# Patient Record
Sex: Male | Born: 1955
Health system: Southern US, Community
[De-identification: ages and names within clinical notes are randomized; demographics above are authoritative.]

## PROBLEM LIST (undated history)

## (undated) DIAGNOSIS — K5792 Diverticulitis of intestine, part unspecified, without perforation or abscess without bleeding: Secondary | ICD-10-CM

## (undated) HISTORY — PX: COLONOSCOPY: SHX174

---

## 2015-07-22 ENCOUNTER — Ambulatory Visit (INDEPENDENT_AMBULATORY_CARE_PROVIDER_SITE_OTHER): Payer: BLUE CROSS/BLUE SHIELD | Admitting: Physician Assistant

## 2015-07-22 ENCOUNTER — Inpatient Hospital Stay (HOSPITAL_COMMUNITY)
Admission: EM | Admit: 2015-07-22 | Discharge: 2015-07-28 | DRG: 392 | Disposition: A | Discharge status: Home / Self Care | Payer: BLUE CROSS/BLUE SHIELD | Attending: Internal Medicine | Admitting: Internal Medicine

## 2015-07-22 ENCOUNTER — Encounter (HOSPITAL_COMMUNITY): Payer: Self-pay

## 2015-07-22 ENCOUNTER — Ambulatory Visit (HOSPITAL_BASED_OUTPATIENT_CLINIC_OR_DEPARTMENT_OTHER)
Admission: RE | Admit: 2015-07-22 | Discharge: 2015-07-22 | Disposition: A | Discharge status: Home / Self Care | Payer: BLUE CROSS/BLUE SHIELD | Source: Ambulatory Visit | Attending: Physician Assistant | Admitting: Physician Assistant

## 2015-07-22 ENCOUNTER — Ambulatory Visit (INDEPENDENT_AMBULATORY_CARE_PROVIDER_SITE_OTHER): Payer: BLUE CROSS/BLUE SHIELD

## 2015-07-22 VITALS — BP 122/72 | HR 87 | Temp 99.6°F | Resp 17 | Ht 68.0 in | Wt 169.0 lb

## 2015-07-22 DIAGNOSIS — D72828 Other elevated white blood cell count: Secondary | ICD-10-CM

## 2015-07-22 DIAGNOSIS — R1030 Lower abdominal pain, unspecified: Secondary | ICD-10-CM

## 2015-07-22 DIAGNOSIS — R319 Hematuria, unspecified: Secondary | ICD-10-CM | POA: Diagnosis not present

## 2015-07-22 DIAGNOSIS — K869 Disease of pancreas, unspecified: Secondary | ICD-10-CM | POA: Diagnosis present

## 2015-07-22 DIAGNOSIS — K5792 Diverticulitis of intestine, part unspecified, without perforation or abscess without bleeding: Secondary | ICD-10-CM | POA: Diagnosis present

## 2015-07-22 DIAGNOSIS — D72829 Elevated white blood cell count, unspecified: Secondary | ICD-10-CM | POA: Diagnosis present

## 2015-07-22 DIAGNOSIS — K5730 Diverticulosis of large intestine without perforation or abscess without bleeding: Secondary | ICD-10-CM | POA: Diagnosis present

## 2015-07-22 DIAGNOSIS — K5720 Diverticulitis of large intestine with perforation and abscess without bleeding: Principal | ICD-10-CM | POA: Diagnosis present

## 2015-07-22 DIAGNOSIS — Z8249 Family history of ischemic heart disease and other diseases of the circulatory system: Secondary | ICD-10-CM

## 2015-07-22 DIAGNOSIS — K8689 Other specified diseases of pancreas: Secondary | ICD-10-CM | POA: Diagnosis present

## 2015-07-22 DIAGNOSIS — R1032 Left lower quadrant pain: Secondary | ICD-10-CM

## 2015-07-22 DIAGNOSIS — K631 Perforation of intestine (nontraumatic): Secondary | ICD-10-CM | POA: Diagnosis present

## 2015-07-22 DIAGNOSIS — K651 Peritoneal abscess: Secondary | ICD-10-CM

## 2015-07-22 HISTORY — DX: Diverticulitis of intestine, part unspecified, without perforation or abscess without bleeding: K57.92

## 2015-07-22 LAB — POCT URINALYSIS DIP (MANUAL ENTRY)
BILIRUBIN UA: NEGATIVE
GLUCOSE UA: NEGATIVE
Ketones, POC UA: NEGATIVE
Leukocytes, UA: NEGATIVE
Nitrite, UA: NEGATIVE
Protein Ur, POC: NEGATIVE
SPEC GRAV UA: 1.015
Urobilinogen, UA: 0.2
pH, UA: 6

## 2015-07-22 LAB — BASIC METABOLIC PANEL
ANION GAP: 11 (ref 5–15)
BUN: 10 mg/dL (ref 6–20)
CHLORIDE: 101 mmol/L (ref 101–111)
CO2: 23 mmol/L (ref 22–32)
Calcium: 9.2 mg/dL (ref 8.9–10.3)
Creatinine, Ser: 0.95 mg/dL (ref 0.61–1.24)
GFR calc non Af Amer: >60 mL/min (ref >60)
Glucose, Bld: 98 mg/dL (ref 65–99)
Potassium: 3.8 mmol/L (ref 3.5–5.1)
Sodium: 135 mmol/L (ref 135–145)

## 2015-07-22 LAB — POCT CBC
GRANULOCYTE PERCENT: 82.9 % — AB (ref 37–80)
HEMATOCRIT: 46.4 % (ref 43.5–53.7)
Hemoglobin: 16.4 g/dL (ref 14.1–18.1)
Lymph, poc: 1.4 (ref 0.6–3.4)
MCH: 31.4 pg — AB (ref 27–31.2)
MCHC: 35.3 g/dL (ref 31.8–35.4)
MCV: 88.8 fL (ref 80–97)
MID (CBC): 1.2 — AB (ref 0–0.9)
MPV: 7.2 fL (ref 0–99.8)
POC Granulocyte: 12.3 — AB (ref 2–6.9)
POC LYMPH PERCENT: 9.2 %L — AB (ref 10–50)
POC MID %: 7.9 % (ref 0–12)
Platelet Count, POC: 247 10*3/uL (ref 142–424)
RBC: 5.22 M/uL (ref 4.69–6.13)
RDW, POC: 13.4 %
WBC: 14.8 10*3/uL — AB (ref 4.6–10.2)

## 2015-07-22 LAB — LACTIC ACID, PLASMA: Lactic Acid, Venous: 0.9 mmol/L (ref 0.5–1.9)

## 2015-07-22 LAB — POC MICROSCOPIC URINALYSIS (UMFC)

## 2015-07-22 LAB — TYPE AND SCREEN
ABO/RH(D): B POS
ANTIBODY SCREEN: NEGATIVE

## 2015-07-22 LAB — PROTIME-INR
INR: 1.26 (ref 0.00–1.49)
Prothrombin Time: 15.9 seconds — ABNORMAL HIGH (ref 11.6–15.2)

## 2015-07-22 LAB — PROCALCITONIN: Procalcitonin: 0.12 ng/mL

## 2015-07-22 LAB — APTT: APTT: 29 s (ref 24–37)

## 2015-07-22 LAB — ABO/RH: ABO/RH(D): B POS

## 2015-07-22 MED ORDER — SODIUM CHLORIDE 0.9 % IV SOLN
INTRAVENOUS | Status: DC
  Administered 2015-07-22 – 2015-07-25 (×6): via INTRAVENOUS

## 2015-07-22 MED ORDER — SODIUM CHLORIDE 0.9 % IV SOLN: INTRAVENOUS | Status: DC

## 2015-07-22 MED ORDER — IOPAMIDOL (ISOVUE-300) INJECTION 61%
100.0000 mL | Freq: Once | INTRAVENOUS | Status: AC | PRN
  Administered 2015-07-22: 100 mL via INTRAVENOUS

## 2015-07-22 MED ORDER — SODIUM CHLORIDE 0.9 % IV BOLUS (SEPSIS)
1000.0000 mL | Freq: Once | INTRAVENOUS | Status: AC
  Administered 2015-07-22: 1000 mL via INTRAVENOUS

## 2015-07-22 MED ORDER — ONDANSETRON HCL 4 MG/2ML IJ SOLN
4.0000 mg | Freq: Once | INTRAMUSCULAR | Status: AC
  Administered 2015-07-22: 4 mg via INTRAVENOUS
  Filled 2015-07-22: qty 2

## 2015-07-22 MED ORDER — SODIUM CHLORIDE 0.9 % IV BOLUS (SEPSIS): 1500.0000 mL | Freq: Once | INTRAVENOUS | Status: DC

## 2015-07-22 MED ORDER — OXYCODONE-ACETAMINOPHEN 5-325 MG PO TABS
2.0000 | ORAL_TABLET | Freq: Four times a day (QID) | ORAL | Status: DC | PRN
  Administered 2015-07-22 – 2015-07-25 (×9): 2 via ORAL
  Administered 2015-07-26 (×2): 1 via ORAL
  Administered 2015-07-27: 2 via ORAL
  Administered 2015-07-27 – 2015-07-28 (×2): 1 via ORAL
  Filled 2015-07-22 (×14): qty 2

## 2015-07-22 MED ORDER — MORPHINE SULFATE (PF) 2 MG/ML IV SOLN
2.0000 mg | INTRAVENOUS | Status: DC | PRN
  Filled 2015-07-22: qty 1

## 2015-07-22 MED ORDER — ACETAMINOPHEN 650 MG RE SUPP: 650.0000 mg | Freq: Four times a day (QID) | RECTAL | Status: DC | PRN

## 2015-07-22 MED ORDER — CIPROFLOXACIN IN D5W 400 MG/200ML IV SOLN
400.0000 mg | Freq: Once | INTRAVENOUS | Status: AC
  Administered 2015-07-22: 400 mg via INTRAVENOUS
  Filled 2015-07-22: qty 200

## 2015-07-22 MED ORDER — ACETAMINOPHEN 325 MG PO TABS
650.0000 mg | ORAL_TABLET | Freq: Four times a day (QID) | ORAL | Status: DC | PRN
  Administered 2015-07-27: 650 mg via ORAL
  Filled 2015-07-22: qty 2

## 2015-07-22 MED ORDER — METRONIDAZOLE IN NACL 5-0.79 MG/ML-% IV SOLN
500.0000 mg | Freq: Once | INTRAVENOUS | Status: AC
  Administered 2015-07-22: 500 mg via INTRAVENOUS
  Filled 2015-07-22: qty 100

## 2015-07-22 MED ORDER — CIPROFLOXACIN IN D5W 400 MG/200ML IV SOLN: 400.0000 mg | Freq: Once | INTRAVENOUS | Status: DC

## 2015-07-22 MED ORDER — METRONIDAZOLE IN NACL 5-0.79 MG/ML-% IV SOLN
500.0000 mg | Freq: Three times a day (TID) | INTRAVENOUS | Status: AC
  Administered 2015-07-23 – 2015-07-27 (×13): 500 mg via INTRAVENOUS
  Filled 2015-07-22 (×14): qty 100

## 2015-07-22 MED ORDER — CIPROFLOXACIN IN D5W 400 MG/200ML IV SOLN: 400.0000 mg | Freq: Two times a day (BID) | INTRAVENOUS | Status: DC

## 2015-07-22 MED ORDER — ONDANSETRON HCL 4 MG/2ML IJ SOLN
4.0000 mg | Freq: Three times a day (TID) | INTRAMUSCULAR | Status: DC | PRN
  Administered 2015-07-24 – 2015-07-27 (×3): 4 mg via INTRAVENOUS
  Filled 2015-07-22 (×3): qty 2

## 2015-07-22 MED ORDER — HYDROMORPHONE HCL 1 MG/ML IJ SOLN
1.0000 mg | Freq: Once | INTRAMUSCULAR | Status: AC
  Administered 2015-07-22: 1 mg via INTRAVENOUS
  Filled 2015-07-22: qty 1

## 2015-07-22 MED ORDER — METRONIDAZOLE IN NACL 5-0.79 MG/ML-% IV SOLN: 500.0000 mg | Freq: Three times a day (TID) | INTRAVENOUS | Status: DC

## 2015-07-22 NOTE — Progress Notes (Signed)
07/22/2015 6:45 PM   DOB: 04-Mar-1955 / MRN: [id number]  SUBJECTIVE:  Marcus [name] is a 60 y.o. male presenting for left lower quadrant abdominal pain with radiation to the left groin. Associates dysuria.   Reports this episode started roughly 3 days ago, however he did have an episode of same about 4 months ago after an acute gastrointestinal illness. States the pain is sharp and comes in waves, and often times seems unbearable.  Took tylenol last night which helped him sleep.  He does not feel that he is getting worse or better.  Denies cough and SOB.   He has No Known Allergies.   He  has no past medical history on file.    He  reports that he has never smoked. He does not have any smokeless tobacco history on file. He  has no sexual activity history on file. The patient  has no past surgical history on file.  His family history is not on file.  Review of Systems  Constitutional: Negative for fever and chills.  Cardiovascular: Negative for chest pain.  Gastrointestinal: Positive for abdominal pain. Negative for heartburn, nausea, vomiting, diarrhea, constipation, blood in stool and melena.  Genitourinary: Positive for dysuria. Negative for urgency, frequency, hematuria and flank pain.  Skin: Negative for itching and rash.  Neurological: Negative for dizziness and headaches.    Problem list and medications reviewed and updated by myself where necessary, and exist elsewhere in the encounter.   OBJECTIVE:  BP 122/72 mmHg  Pulse 87  Temp(Src) 99.6 F (37.6 C) (Oral)  Resp 17  Ht 5\' 8"  (1.727 m)  Wt 169 lb (76.658 kg)  BMI 25.70 kg/m2  SpO2 98%  Physical Exam  Constitutional: He is oriented to person, place, and time. He appears well-developed. No distress.  Cardiovascular: Normal rate and regular rhythm.   Pulmonary/Chest: Effort normal and breath sounds normal. No respiratory distress. He has no wheezes. He has no rales. He exhibits no tenderness.  Abdominal: He  exhibits no distension and no mass. There is tenderness (llq). There is no rebound and no guarding. Hernia confirmed negative in the right inguinal area and confirmed negative in the left inguinal area.  Genitourinary: Penis normal. Right testis shows no tenderness. Left testis shows no tenderness.  Lymphadenopathy:       Right: No inguinal adenopathy present.       Left: No inguinal adenopathy present.  Neurological: He is alert and oriented to person, place, and time. He has normal reflexes.    Results for orders placed or performed in visit on 07/22/15 (from the past 72 hour(s))  POCT urinalysis dipstick     Status: Abnormal   Collection Time: 07/22/15  2:58 PM  Result Value Ref Range   Color, UA yellow yellow   Clarity, UA clear clear   Glucose, UA negative negative   Bilirubin, UA negative negative   Ketones, POC UA negative negative   Spec Grav, UA 1.015    Blood, UA trace-intact (A) negative   pH, UA 6.0    Protein Ur, POC negative negative   Urobilinogen, UA 0.2    Nitrite, UA Negative Negative   Leukocytes, UA Negative Negative  POCT CBC     Status: Abnormal   Collection Time: 07/22/15  3:14 PM  Result Value Ref Range   WBC 14.8 (A) 4.6 - 10.2 K/uL   Lymph, poc 1.4 0.6 - 3.4   POC LYMPH PERCENT 9.2 (A) 10 - 50 %L   MID (  cbc) 1.2 (A) 0 - 0.9   POC MID % 7.9 0 - 12 %M   POC Granulocyte 12.3 (A) 2 - 6.9   Granulocyte percent 82.9 (A) 37 - 80 %G   RBC 5.22 4.69 - 6.13 M/uL   Hemoglobin 16.4 14.1 - 18.1 g/dL   HCT, POC 69.646.4 29.543.5 - 53.7 %   MCV 88.8 80 - 97 fL   MCH, POC 31.4 (A) 27 - 31.2 pg   MCHC 35.3 31.8 - 35.4 g/dL   RDW, POC 28.413.4 %   Platelet Count, POC 247 142 - 424 K/uL   MPV 7.2 0 - 99.8 fL  POCT Microscopic Urinalysis ([facility])     Status: Abnormal   Collection Time: 07/22/15  3:27 PM  Result Value Ref Range   WBC,UR,HPF,POC None None WBC/hpf   RBC,UR,HPF,POC None None RBC/hpf   Bacteria Few (A) None, Too numerous to count   Mucus Present (A) Absent    Epithelial Cells, UR Per Microscopy Few (A) None, Too numerous to count cells/hpf    Ct Abdomen Pelvis W Contrast  07/22/2015  CLINICAL DATA:  Left lower quadrant pain. EXAM: CT ABDOMEN AND PELVIS WITH CONTRAST TECHNIQUE: Multidetector CT imaging of the abdomen and pelvis was performed using the standard protocol following bolus administration of intravenous contrast. CONTRAST:  100mL ISOVUE-300 IOPAMIDOL (ISOVUE-300) INJECTION 61% COMPARISON:  KUB from earlier today FINDINGS: Lower chest:  No acute findings. Hepatobiliary: There is 15 mm low-attenuation mass in the right hepatic lobe on series 2, image 22 which is too small to characterize but most likely either a cyst or hemangioma. Hepatic steatosis is identified. The liver, portal vein, and gallbladder are otherwise normal. Pancreas: There is a tiny low-attenuation lesion measuring 4 mm in the pancreatic head on series 2, image 25. The attenuation in this lesion is 3 Hounsfield units most consistent with a small cyst. The remainder of the pancreas is normal in appearance. Spleen: Within normal limits in size and appearance. Adrenals/Urinary Tract: No masses identified. No evidence of hydronephrosis. Stomach/Bowel: The stomach small bowel are normal. Diverticuli are seen throughout colon, particularly in the sigmoid colon. There is fat stranding adjacent to the sigmoid colon with extraluminal gas and fluid seen on series 2, image 64 measuring 4.6 x 2.0 cm, consistent with perforated diverticulitis. There is mild associated bowel wall thickening in this region. The remainder of the colon and appendix are normal in appearance. Vascular/Lymphatic: There is a fat containing umbilical hernia. The abdominal aorta is normal. No adenopathy. Reproductive: No mass or other significant abnormality. Other: None. Musculoskeletal: Degenerative changes are seen in the SI joints. No other bony abnormalities. IMPRESSION: 1. Perforated sigmoid diverticulitis with a 4.6 x 2.0  cm collection of extraluminal gas and air. No free air. 2. 4 mm low-attenuation mass in pancreatic head, likely a small cyst. Recommend an MRI in 1 year to ensure stability. 3. No other acute abnormalities. The findings were called to the patient's physician assistant, Deliah BostonMichael Rosalie Buenaventura. Electronically Signed   By: Gerome Samavid  Williams III M.D   On: 07/22/2015 18:26   Dg Abd 2 Views  07/22/2015  CLINICAL DATA:  Left lower abdomen pain radiating to the groin EXAM: ABDOMEN - 2 VIEW COMPARISON:  None. FINDINGS: No opaque renal or ureteral calculi are noted by plain film. The bowel gas pattern is nonspecific. No bony abnormality is seen. IMPRESSION: No opaque calculi are noted.  The bowel gas pattern is nonspecific. Electronically Signed   By: Lucienne MinksPaul  Barry M.D.  On: 07/22/2015 15:11    ASSESSMENT AND PLAN  Darly was seen today for abdominal pain.  Diagnoses and all orders for this visit:  Nethaniel was seen today for abdominal pain.  Diagnoses and all orders for this visit:  Lower abdominal pain: Rads negative.  CBC showing concern for an acute intraabdominal process. Will CT stat. Update @ 6:45 PM Radiology called and advised of perforated diverticulum.  We have called the patient and advised that he go directly to [facility] ED for further eval and management.  We have called [facility] charge and advised that he will be coming in.   -     POCT CBC -     POCT urinalysis dipstick -     COMPLETE METABOLIC PANEL WITH GFR -     Lipase -     CT ABDOMEN PELVIS W CONTRAST; Future  Left groin pain -     DG Abd 2 Views; Future  Hematuria -     POCT Microscopic Urinalysis ([facility])  Granulocytosis -     CT ABDOMEN PELVIS W CONTRAST; Future     The patient was advised to call or return to clinic if he does not see an improvement in symptoms, or to seek the care of the closest emergency department if he worsens with the above plan.   Deliah Boston, MHS, PA-C [facility] 07/22/2015 6:45 PM

## 2015-07-22 NOTE — H&P (Signed)
History and Physical    Marcus [name] ZOX:[id number] DOB: 1955-07-17 DOA: 07/22/2015  Referring MD/NP/PA:   PCP: Dolores Lory, PA-C   Patient coming from:  The patient is coming from home.  At baseline, pt is independent for most of ADL.     Chief Complaint: Abdominal pain  HPI: Marcus [name] is a 60 y.o. male with medical history significant of diverticulitis, who presents with abdominal pain.  Patient reports that his abdominal pain started yesterday. It is located lower abdomen, mainly in the left side, intermittent, sharp, radiating to the groin areas. It happens every 2 or 3 hours, each time it lasts for about 1 to 1.5 hours. He does not have nausea, vomiting, diarrhea. No fever or chills. He tool ibuprofen and Tylenol with some help at home. Patient was seen in urgent care today, and had CT abdomen/pelvis, showing perforated sigmoid colon and diverticulitis. Patient reports mild dry cough, but no chest pain or shortness of breath. No symptoms of UTI. No unilateral weakness or rashes.  ED Course: pt was found to have WBC 14.8, temperature 99.9, heart rate at 90s, electrolytes and renal function okay. Patient is admitted to MedSurg bed for further direction treatment with general surgeon will be consulted by EDP.  Review of Systems:   General: no fevers, chills, no changes in body weight, has poor appetite, has fatigue HEENT: no blurry vision, hearing changes or sore throat Pulm: no dyspnea, has coughing, no wheezing CV: no chest pain, no palpitations Abd: no nausea, vomiting, has abdominal pain, no diarrhea, constipation GU: no dysuria, burning on urination, increased urinary frequency, hematuria  Ext: no leg edema Neuro: no unilateral weakness, numbness, or tingling, no vision change or hearing loss Skin: no rash MSK: No muscle spasm, no deformity, no limitation of range of movement in spin Heme: No easy bruising.  Travel history: No recent long distant  travel.  Allergy: No Known Allergies  Past Medical History  Diagnosis Date  . Diverticulitis     Past Surgical History  Procedure Laterality Date  . Colonoscopy      Social History:  reports that he has never smoked. He does not have any smokeless tobacco history on file. He reports that he does not drink alcohol or use illicit drugs.  Family History:  Family History  Problem Relation Age of Onset  . Diverticulitis Mother   . Urolithiasis Father   . Hypertension Father   . Hypertension Brother   . Depression Brother      Prior to Admission medications   Medication Sig Start Date End Date Taking? Authorizing Provider  acetaminophen (TYLENOL) 500 MG tablet Take 500 mg by mouth every 6 (six) hours as needed for moderate pain.   Yes Historical Provider, MD  ibuprofen (ADVIL,MOTRIN) 200 MG tablet Take 400 mg by mouth every 6 (six) hours as needed for moderate pain.   Yes Historical Provider, MD    Physical Exam: Filed Vitals:   07/22/15 1934  BP: 130/79  Pulse: 96  Temp: 99.9 F (37.7 C)  TempSrc: Oral  Resp: 16  Height: 5\' 8"  (1.727 m)  Weight: 76.658 kg (169 lb)  SpO2: 96%   General: Not in acute distress HEENT:       Eyes: PERRL, EOMI, no scleral icterus.       ENT: No discharge from the ears and nose, no pharynx injection, no tonsillar enlargement.        Neck: No JVD, no bruit, no mass felt. Heme: No neck lymph  node enlargement. Cardiac: S1/S2, RRR, No murmurs, No gallops or rubs. Pulm:  No rales, wheezing, rhonchi or rubs. Abd: Soft, nondistended, tender over lower abdomen, no rebound pain, no organomegaly, BS present. GU: No hematuria Ext: No pitting leg edema bilaterally. 2+DP/PT pulse bilaterally. Musculoskeletal: No joint deformities, No joint redness or warmth, no limitation of ROM in spin. Skin: No rashes.  Neuro: Alert, oriented X3, cranial nerves II-XII grossly intact, moves all extremities normally. Psych: Patient is not psychotic, no suicidal or  hemocidal ideation.  Labs on Admission: I have personally reviewed following labs and imaging studies  CBC:  Recent Labs Lab 07/22/15 1514  WBC 14.8*  HGB 16.4  HCT 46.4  MCV 88.8   Basic Metabolic Panel:  Recent Labs Lab 07/22/15 1936  NA 135  K 3.8  CL 101  CO2 23  GLUCOSE 98  BUN 10  CREATININE 0.95  CALCIUM 9.2   GFR: Estimated Creatinine Clearance: 81 mL/min (by C-G formula based on Cr of 0.95). Liver Function Tests: No results for input(s): AST, ALT, ALKPHOS, BILITOT, PROT, ALBUMIN in the last 168 hours. No results for input(s): LIPASE, AMYLASE in the last 168 hours. No results for input(s): AMMONIA in the last 168 hours. Coagulation Profile: No results for input(s): INR, PROTIME in the last 168 hours. Cardiac Enzymes: No results for input(s): CKTOTAL, CKMB, CKMBINDEX, TROPONINI in the last 168 hours. BNP (last 3 results) No results for input(s): PROBNP in the last 8760 hours. HbA1C: No results for input(s): HGBA1C in the last 72 hours. CBG: No results for input(s): GLUCAP in the last 168 hours. Lipid Profile: No results for input(s): CHOL, HDL, LDLCALC, TRIG, CHOLHDL, LDLDIRECT in the last 72 hours. Thyroid Function Tests: No results for input(s): TSH, T4TOTAL, FREET4, T3FREE, THYROIDAB in the last 72 hours. Anemia Panel: No results for input(s): VITAMINB12, FOLATE, FERRITIN, TIBC, IRON, RETICCTPCT in the last 72 hours. Urine analysis:    Component Value Date/Time   BILIRUBINUR negative 07/22/2015 1458   KETONESUR negative 07/22/2015 1458   PROTEINUR negative 07/22/2015 1458   UROBILINOGEN 0.2 07/22/2015 1458   NITRITE Negative 07/22/2015 1458   LEUKOCYTESUR Negative 07/22/2015 1458   Sepsis Labs: @LABRCNTIP (procalcitonin:4,lacticidven:4) )No results found for this or any previous visit (from the past 240 hour(s)).   Radiological Exams on Admission: Ct Abdomen Pelvis W Contrast  07/22/2015  CLINICAL DATA:  Left lower quadrant pain. EXAM: CT  ABDOMEN AND PELVIS WITH CONTRAST TECHNIQUE: Multidetector CT imaging of the abdomen and pelvis was performed using the standard protocol following bolus administration of intravenous contrast. CONTRAST:  100mL ISOVUE-300 IOPAMIDOL (ISOVUE-300) INJECTION 61% COMPARISON:  KUB from earlier today FINDINGS: Lower chest:  No acute findings. Hepatobiliary: There is 15 mm low-attenuation mass in the right hepatic lobe on series 2, image 22 which is too small to characterize but most likely either a cyst or hemangioma. Hepatic steatosis is identified. The liver, portal vein, and gallbladder are otherwise normal. Pancreas: There is a tiny low-attenuation lesion measuring 4 mm in the pancreatic head on series 2, image 25. The attenuation in this lesion is 3 Hounsfield units most consistent with a small cyst. The remainder of the pancreas is normal in appearance. Spleen: Within normal limits in size and appearance. Adrenals/Urinary Tract: No masses identified. No evidence of hydronephrosis. Stomach/Bowel: The stomach small bowel are normal. Diverticuli are seen throughout colon, particularly in the sigmoid colon. There is fat stranding adjacent to the sigmoid colon with extraluminal gas and fluid seen on series 2, image  64 measuring 4.6 x 2.0 cm, consistent with perforated diverticulitis. There is mild associated bowel wall thickening in this region. The remainder of the colon and appendix are normal in appearance. Vascular/Lymphatic: There is a fat containing umbilical hernia. The abdominal aorta is normal. No adenopathy. Reproductive: No mass or other significant abnormality. Other: None. Musculoskeletal: Degenerative changes are seen in the SI joints. No other bony abnormalities. IMPRESSION: 1. Perforated sigmoid diverticulitis with a 4.6 x 2.0 cm collection of extraluminal gas and air. No free air. 2. 4 mm low-attenuation mass in pancreatic head, likely a small cyst. Recommend an MRI in 1 year to ensure stability. 3. No  other acute abnormalities. The findings were called to the patient's physician assistant, Deliah Boston. Electronically Signed   By: Gerome Sam III M.D   On: 07/22/2015 18:26   Dg Abd 2 Views  07/22/2015  CLINICAL DATA:  Left lower abdomen pain radiating to the groin EXAM: ABDOMEN - 2 VIEW COMPARISON:  None. FINDINGS: No opaque renal or ureteral calculi are noted by plain film. The bowel gas pattern is nonspecific. No bony abnormality is seen. IMPRESSION: No opaque calculi are noted.  The bowel gas pattern is nonspecific. Electronically Signed   By: Dwyane Dee M.D.   On: 07/22/2015 15:11     EKG: Not done in ED, will get one.   Assessment/Plan Principal Problem:   Perforated sigmoid colon (HCC) Active Problems:   Mass of pancreas   Diverticulitis   Diverticulitis in the perforated sigmoid colon: Patient's abdominal pain is caused by perforated sigmoid colon and diverticulitis as evidenced by CT abdomen/pelvis. Patient has leukocytosis with WBC 14.8, but not toxic. Currently patient does not meet criteria for sepsis, but lactate level is pending. If lactate level is elevated, patient will then meet criteria for sepsis. Currently hemodynamically stable.  -will admit to med-surg bed -keep NPO -Gen. surgery will be consulted by EDP, with follow-up recommendations -When necessary Zofran for nausea, morphine and Percocet for pain. -will get Procalcitonin and trend lactic acid levels -IVF: 2L of NS bolus in ED, followed by 125 cc/h  -INR/PTT/type & screen -IV Flagyl and Cipro  Mass of pancreas: pt had an incidental findings by CT abdomen/pelvis, showing 4 mm low-attenuation mass in pancreatic head, likely a small cyst per radiologist. -Recommend an MRI in 1 year to ensure stability per radiologist.   DVT ppx: SCD Code Status: Full code Family Communication: None at bed side.  Disposition Plan:  Anticipate discharge back to previous home environment Consults called:  General surgeon  will be consulted by EDP. Admission status:  medical floor/obs    Date of Service 07/22/2015    Lorretta Harp [facility] Pager [phone]  If 7PM-7AM, please contact night-coverage [email or web address] Password [facility] 07/22/2015, 9:18 PM

## 2015-07-22 NOTE — ED Notes (Signed)
Pt sent by Dr after CT scan. CT scan showed perforated diverticulum. Pt A&Ox4. Endorses severe abdominal pain. Ambulatory. Denies history of diverticulitis.

## 2015-07-22 NOTE — Patient Instructions (Signed)
Please report now to [facility] at [address].  Phone number is [phone].    IF you received an x-ray today, you will receive an invoice from [facility]. Please contact [facility] at [phone] with questions or concerns regarding your invoice.   IF you received labwork today, you will receive an invoice from [facility]. Please contact [facility] at [phone] with questions or concerns regarding your invoice.   Our billing staff will not be able to assist you with questions regarding bills from these companies.  You will be contacted with the lab results as soon as they are available. The fastest way to get your results is to activate your My Chart account. Instructions are located on the last page of this paperwork. If you have not heard from us regarding the results in 2 weeks, please contact this office.

## 2015-07-22 NOTE — Consult Note (Signed)
Discussed case with Dr. Tona SensingSteinl--CT scan reviewed.  CCS will follow up with this patient who has diverticulitis with an abscess around his sigmoid.  IV antibiotics and IR consult regarding drainage indicated.  Patient otherwise is very stable.  Wenda LowMatt Mirra Basilio, MD, FACS

## 2015-07-22 NOTE — Progress Notes (Signed)
Pharmacy Antibiotic Note  Marcus [name] is a 60 y.o. male admitted on 07/22/2015 with perforated diverticulitis.  Pharmacy has been consulted for ciprofloxacin dosing.  Plan:  Ciprofloxacin 400 mg IV q12 hr  Flagyl per MD  Please note that due to local E. coli resistance rates to Ciprofloxacin > 10%, Rocephin (in conjunction with Flagyl) is preferred over Cipro for Tx of intra-abdominal infections  Height: 5\' 8"  (172.7 cm) Weight: 169 lb (76.658 kg) IBW/kg (Calculated) : 68.4  Temp (24hrs), Avg:99.8 F (37.7 C), Min:99.6 F (37.6 C), Max:99.9 F (37.7 C)   Recent Labs Lab 07/22/15 1514 07/22/15 1936  WBC 14.8*  --   CREATININE  --  0.95    Estimated Creatinine Clearance: 81 mL/min (by C-G formula based on Cr of 0.95).    No Known Allergies  Antimicrobials this admission: Cipro 7/18 >>  Flagyl (MD) 7/18 >>   Dose adjustments this admission: ---  Microbiology results: 7/18 BCx: sent   Thank you for allowing pharmacy to be a part of this patient's care.  Bernadene Personrew Dorna Mallet, PharmD, BCPS Pager: [phone] 07/22/2015, 9:36 PM

## 2015-07-22 NOTE — ED Notes (Signed)
MD at bedside. 

## 2015-07-22 NOTE — ED Notes (Signed)
Clark, PA-C called. [facility] confirmed perforated divert[facility]. Sent to ED for eval.

## 2015-07-22 NOTE — ED Provider Notes (Addendum)
CSN: [id number]     Arrival date & time 07/22/15  1912 History   First MD Initiated Contact with Patient 07/22/15 1914     Chief Complaint  Patient presents with  . Abdominal Pain     (Consider location/radiation/quality/duration/timing/severity/associated sxs/prior Treatment) Patient is a 60 y.o. male presenting with abdominal pain. The history is provided by the patient.  Abdominal Pain Associated symptoms: nausea   Associated symptoms: no chest pain, no chills, no dysuria, no shortness of breath, no sore throat and no vomiting   Patient c/o lower abdominal pain, esp left, onset yesterday. Pain persistent since, mod-severe, waxes and wanes in intensity.  No hx same pain. No radiation. Nausea. No vomiting. Low grade fever in ED, no chills or sweats. No dysuria or gu c/o.   No prior hx diverticula/itis.  Patient had outpatient ct today with perforated diverticulitis.       History reviewed. No pertinent past medical history. History reviewed. No pertinent past surgical history. History reviewed. No pertinent family history. Social History  Substance Use Topics  . Smoking status: Never Smoker   . Smokeless tobacco: None  . Alcohol Use: None    Review of Systems  Constitutional: Negative for chills.  HENT: Negative for sore throat.   Eyes: Negative for redness.  Respiratory: Negative for shortness of breath.   Cardiovascular: Negative for chest pain.  Gastrointestinal: Positive for nausea and abdominal pain. Negative for vomiting.  Genitourinary: Negative for dysuria.  Musculoskeletal: Negative for back pain and neck pain.  Skin: Negative for rash.  Neurological: Negative for headaches.  Hematological: Does not bruise/bleed easily.  Psychiatric/Behavioral: Negative for confusion.      Allergies  Review of patient's allergies indicates no known allergies.  Home Medications   Prior to Admission medications   Medication Sig Start Date End Date Taking? Authorizing  Provider  acetaminophen (TYLENOL) 500 MG tablet Take 500 mg by mouth every 6 (six) hours as needed for moderate pain.   Yes Historical Provider, MD  ibuprofen (ADVIL,MOTRIN) 200 MG tablet Take 400 mg by mouth every 6 (six) hours as needed for moderate pain.   Yes Historical Provider, MD   BP 130/79 mmHg  Pulse 96  Temp(Src) 99.9 F (37.7 C) (Oral)  Resp 16  Ht 5\' 8"  (1.727 m)  Wt 76.658 kg  BMI 25.70 kg/m2  SpO2 96% Physical Exam  Constitutional: He appears well-developed and well-nourished. No distress.  HENT:  Mouth/Throat: Oropharynx is clear and moist.  Eyes: Conjunctivae are normal. No scleral icterus.  Neck: Neck supple. No tracheal deviation present.  Cardiovascular: Normal rate, regular rhythm, normal heart sounds and intact distal pulses.   No murmur heard. Pulmonary/Chest: Effort normal and breath sounds normal. No accessory muscle usage. No respiratory distress.  Abdominal: Soft. Bowel sounds are normal. He exhibits no distension and no mass. There is tenderness. There is no rebound and no guarding.  Moderate LLQ tenderness.   Genitourinary:  No cva tenderness  Musculoskeletal: He exhibits no edema.  Neurological: He is alert.  Skin: Skin is warm and dry. No rash noted. He is not diaphoretic.  Psychiatric: He has a normal mood and affect.  Nursing note and vitals reviewed.   ED Course  Procedures (including critical care time) Labs Review   Results for orders placed or performed during the hospital encounter of 07/22/15  Basic metabolic panel  Result Value Ref Range   Sodium 135 135 - 145 mmol/L   Potassium 3.8 3.5 - 5.1 mmol/L  Chloride 101 101 - 111 mmol/L   CO2 23 22 - 32 mmol/L   Glucose, Bld 98 65 - 99 mg/dL   BUN 10 6 - 20 mg/dL   Creatinine, Ser 1.47 0.61 - 1.24 mg/dL   Calcium 9.2 8.9 - 82.9 mg/dL   GFR calc non Af Amer >60 >60 mL/min   GFR calc Af Amer >60 >60 mL/min   Anion gap 11 5 - 15   Ct Abdomen Pelvis W Contrast  07/22/2015  CLINICAL  DATA:  Left lower quadrant pain. EXAM: CT ABDOMEN AND PELVIS WITH CONTRAST TECHNIQUE: Multidetector CT imaging of the abdomen and pelvis was performed using the standard protocol following bolus administration of intravenous contrast. CONTRAST:  ISOVUE-300 IOPAMIDOL (ISOVUE-300) INJECTION 61% COMPARISON:  KUB from earlier today FINDINGS: Lower chest:  No acute findings. Hepatobiliary: There is 15 mm low-attenuation mass in the right hepatic lobe on series 2, image 22 which is too small to characterize but most likely either a cyst or hemangioma. Hepatic steatosis is identified. The liver, portal vein, and gallbladder are otherwise normal. Pancreas: There is a tiny low-attenuation lesion measuring 4 mm in the pancreatic head on series 2, image 25. The attenuation in this lesion is 3 Hounsfield units most consistent with a small cyst. The remainder of the pancreas is normal in appearance. Spleen: Within normal limits in size and appearance. Adrenals/Urinary Tract: No masses identified. No evidence of hydronephrosis. Stomach/Bowel: The stomach small bowel are normal. Diverticuli are seen throughout colon, particularly in the sigmoid colon. There is fat stranding adjacent to the sigmoid colon with extraluminal gas and fluid seen on series 2, image 64 measuring 4.6 x 2.0 cm, consistent with perforated diverticulitis. There is mild associated bowel wall thickening in this region. The remainder of the colon and appendix are normal in appearance. Vascular/Lymphatic: There is a fat containing umbilical hernia. The abdominal aorta is normal. No adenopathy. Reproductive: No mass or other significant abnormality. Other: None. Musculoskeletal: Degenerative changes are seen in the SI joints. No other bony abnormalities. IMPRESSION: 1. Perforated sigmoid diverticulitis with a 4.6 x 2.0 cm collection of extraluminal gas and air. No free air. 2. 4 mm low-attenuation mass in pancreatic head, likely a small cyst. Recommend an  MRI in 1 year to ensure stability. 3. No other acute abnormalities. The findings were called to the patient's physician assistant, Deliah Boston. Electronically Signed   By: Gerome Sam III M.D   On: 07/22/2015 18:26   Dg Abd 2 Views  07/22/2015  CLINICAL DATA:  Left lower abdomen pain radiating to the groin EXAM: ABDOMEN - 2 VIEW COMPARISON:  None. FINDINGS: No opaque renal or ureteral calculi are noted by plain film. The bowel gas pattern is nonspecific. No bony abnormality is seen. IMPRESSION: No opaque calculi are noted.  The bowel gas pattern is nonspecific. Electronically Signed   By: Dwyane Dee M.D.   On: 07/22/2015 15:11       I have personally reviewed and evaluated these images and lab results as part of my medical decision-making.    MDM   Iv ns bolus.  Dilaudid 1 mg iv, zofran iv.  cipro and flagyl iv.  Reviewed nursing notes and prior charts for additional history.   Patient with cbc from office visit today, bmet added to labs.  Hospitalists consulted for admission.  Patient may need IR consult in AM for possible guided drain procedure.   Discussed with Dr Clyde Lundborg - he requests temp orders to med/surg bed,  and a gene surgery consult. General surgery consulted. Temp orders placed.   Discussed with Dr Wenda LowMatt Martin, he indicates they will follow/consult, and that they will plan to see in AM tomorrow (and that for now/tonight, ivf, iv abx, no additional overnight tx/eval needed).      Cathren LaineKevin Hazael Olveda, MD 07/22/15 2136

## 2015-07-23 DIAGNOSIS — K631 Perforation of intestine (nontraumatic): Secondary | ICD-10-CM

## 2015-07-23 LAB — COMPLETE METABOLIC PANEL WITH GFR
ALT: 23 U/L (ref 9–46)
AST: 17 U/L (ref 10–35)
Albumin: 4.4 g/dL (ref 3.6–5.1)
Alkaline Phosphatase: 46 U/L (ref 40–115)
BUN: 11 mg/dL (ref 7–25)
CALCIUM: 9.5 mg/dL (ref 8.6–10.3)
CHLORIDE: 101 mmol/L (ref 98–110)
CO2: 26 mmol/L (ref 20–31)
CREATININE: 1.07 mg/dL (ref 0.70–1.33)
GFR, Est African American: 87 mL/min (ref >=60)
GFR, Est Non African American: 76 mL/min (ref >=60)
Glucose, Bld: 89 mg/dL (ref 65–99)
Potassium: 5 mmol/L (ref 3.5–5.3)
Sodium: 140 mmol/L (ref 135–146)
Total Bilirubin: 1.3 mg/dL — ABNORMAL HIGH (ref 0.2–1.2)
Total Protein: 7 g/dL (ref 6.1–8.1)

## 2015-07-23 LAB — CBC
HEMATOCRIT: 41.9 % (ref 39.0–52.0)
HEMOGLOBIN: 14.4 g/dL (ref 13.0–17.0)
MCH: 30.3 pg (ref 26.0–34.0)
MCHC: 34.4 g/dL (ref 30.0–36.0)
MCV: 88 fL (ref 78.0–100.0)
Platelets: 213 10*3/uL (ref 150–400)
RBC: 4.76 MIL/uL (ref 4.22–5.81)
RDW: 13.1 % (ref 11.5–15.5)
WBC: 15 10*3/uL — ABNORMAL HIGH (ref 4.0–10.5)

## 2015-07-23 LAB — BASIC METABOLIC PANEL
ANION GAP: 5 (ref 5–15)
BUN: 9 mg/dL (ref 6–20)
CO2: 25 mmol/L (ref 22–32)
Calcium: 8.1 mg/dL — ABNORMAL LOW (ref 8.9–10.3)
Chloride: 106 mmol/L (ref 101–111)
Creatinine, Ser: 0.9 mg/dL (ref 0.61–1.24)
GLUCOSE: 96 mg/dL (ref 65–99)
POTASSIUM: 3.7 mmol/L (ref 3.5–5.1)
Sodium: 136 mmol/L (ref 135–145)

## 2015-07-23 LAB — LIPASE: LIPASE: 20 U/L (ref 7–60)

## 2015-07-23 LAB — LACTIC ACID, PLASMA: Lactic Acid, Venous: 0.8 mmol/L (ref 0.5–1.9)

## 2015-07-23 LAB — GLUCOSE, CAPILLARY: GLUCOSE-CAPILLARY: 85 mg/dL (ref 65–99)

## 2015-07-23 MED ORDER — CEFTRIAXONE SODIUM 2 G IJ SOLR
2.0000 g | Freq: Every day | INTRAMUSCULAR | Status: AC
  Administered 2015-07-23 – 2015-07-27 (×5): 2 g via INTRAVENOUS
  Filled 2015-07-23 (×5): qty 2

## 2015-07-23 MED ORDER — LIP MEDEX EX OINT
TOPICAL_OINTMENT | CUTANEOUS | Status: AC
  Administered 2015-07-23: 12:00:00
  Filled 2015-07-23: qty 7

## 2015-07-23 NOTE — Consult Note (Signed)
Reason for Consult:  Diverticulitis with perforation Referring Physician: Dr. Colin Broach PCP:  Carl Best  Marcus [name] is an 60 y.o. male.  HPI: Pt presented to the Urgent Care facility yesterday with abdominal pain LLQ going to the groin.  This started 3 days ago, but had a similar episode about 4 months ago. The first episode lasted about 2-3 days.  Started with similar pain, but not as severe.  He could not eat and stayed in bed the first day, lived on soup for 2-3 days and the pain passed.   The pain yesterday started about 3 PM, pain comes in waves and is sharpe, very severe, more on the left than the right and goes to his groin.  He took Tylenol and Ibuprofen for pain. Then called his PCP.  He was seen at the Urgent care and after CT admitted thru the ED.  Work up show he is afebrile, VSS, CMP is normal, WBC was 14.8, UA was normal.  CT scan shows: The stomach small bowel are normal. Diverticuli are seen throughout colon, particularly in the sigmoid colon. There is fat stranding adjacent to the sigmoid colon with extraluminal gas and fluid seen on series 2, image 64 measuring 4.6 x 2.0 cm, consistent with perforated diverticulitis. There is mild associated bowel wall thickening in this region. The remainder of the colon and appendix are normal in appearance.  4 mm low-attenuation mass in pancreatic head, likely a small cyst. Recommend an MRI in 1 year to ensure stability.   He was then sent to the ED and admitted to Medicine.  He was started on Cipro and Flagyl IV and we are ask to see.  WBC this AM is up to 15K.  Past Medical History  Diagnosis Date  . Diverticulitis     Past Surgical History  Procedure Laterality Date  . Colonoscopy      Family History  Problem Relation Age of Onset  . Diverticulitis Mother   . Urolithiasis Father   . Hypertension Father   . Hypertension Brother   . Depression Brother     Social History:  reports that he has never smoked. He does not have  any smokeless tobacco history on file. He reports that he does not drink alcohol or use illicit drugs. Married; works in [employer] 1 son 17 ETOH: none Tobacco:  None Drugs: none  Allergies: No Known Allergies  Medications:  Prior to Admission:  Prescriptions prior to admission  Medication Sig Dispense Refill Last Dose  . acetaminophen (TYLENOL) 500 MG tablet Take 500 mg by mouth every 6 (six) hours as needed for moderate pain.   07/22/2015 at Unknown time  . ibuprofen (ADVIL,MOTRIN) 200 MG tablet Take 400 mg by mouth every 6 (six) hours as needed for moderate pain.   07/22/2015 at Unknown time   Anti-infectives    Start     Dose/Rate Route Frequency Ordered Stop   07/23/15 1000  ciprofloxacin (CIPRO) IVPB 400 mg     400 mg 200 mL/hr over 60 Minutes Intravenous 2 times daily 07/22/15 2130     07/23/15 0400  metroNIDAZOLE (FLAGYL) IVPB 500 mg     500 mg 100 mL/hr over 60 Minutes Intravenous Every 8 hours 07/22/15 2130     07/22/15 2200  ciprofloxacin (CIPRO) IVPB 400 mg     400 mg 200 mL/hr over 60 Minutes Intravenous  Once 07/22/15 2100     07/22/15 2115  metroNIDAZOLE (FLAGYL) IVPB 500 mg  Status:  Discontinued  500 mg 100 mL/hr over 60 Minutes Intravenous Every 8 hours 07/22/15 2100 07/22/15 2130   07/22/15 1930  ciprofloxacin (CIPRO) IVPB 400 mg     400 mg 200 mL/hr over 60 Minutes Intravenous  Once 07/22/15 1929 07/22/15 2054   07/22/15 1930  metroNIDAZOLE (FLAGYL) IVPB 500 mg     500 mg 100 mL/hr over 60 Minutes Intravenous  Once 07/22/15 1929 07/22/15 2154      Results for orders placed or performed during the hospital encounter of 07/22/15 (from the past 48 hour(s))  Basic metabolic panel     Status: None   Collection Time: 07/22/15  7:36 PM  Result Value Ref Range   Sodium 135 135 - 145 mmol/L   Potassium 3.8 3.5 - 5.1 mmol/L   Chloride 101 101 - 111 mmol/L   CO2 23 22 - 32 mmol/L   Glucose, Bld 98 65 - 99 mg/dL   BUN 10 6 - 20 mg/dL   Creatinine, Ser 0.95 0.61  - 1.24 mg/dL   Calcium 9.2 8.9 - 10.3 mg/dL   GFR calc non Af Amer >60 >60 mL/min   GFR calc Af Amer >60 >60 mL/min    Comment: (NOTE) The eGFR has been calculated using the CKD EPI equation. This calculation has not been validated in all clinical situations. eGFR's persistently <60 mL/min signify possible Chronic Kidney Disease.    Anion gap 11 5 - 15  Type and screen [place]     Status: None   Collection Time: 07/22/15 10:03 PM  Result Value Ref Range   ABO/RH(D) B POS    Antibody Screen NEG    Sample Expiration 07/25/2015   ABO/Rh     Status: None   Collection Time: 07/22/15 10:03 PM  Result Value Ref Range   ABO/RH(D) B POS   Protime-INR     Status: Abnormal   Collection Time: 07/22/15 10:17 PM  Result Value Ref Range   Prothrombin Time 15.9 (H) 11.6 - 15.2 seconds   INR 1.26 0.00 - 1.49  APTT     Status: None   Collection Time: 07/22/15 10:17 PM  Result Value Ref Range   aPTT 29 24 - 37 seconds  Lactic acid, plasma     Status: None   Collection Time: 07/22/15 10:17 PM  Result Value Ref Range   Lactic Acid, Venous 0.9 0.5 - 1.9 mmol/L  Procalcitonin     Status: None   Collection Time: 07/22/15 10:17 PM  Result Value Ref Range   Procalcitonin 0.12 ng/mL    Comment:        Interpretation: PCT (Procalcitonin) <= 0.5 ng/mL: Systemic infection (sepsis) is not likely. Local bacterial infection is possible. (NOTE)         ICU PCT Algorithm               Non ICU PCT Algorithm    ----------------------------     ------------------------------         PCT < 0.25 ng/mL                 PCT < 0.1 ng/mL     Stopping of antibiotics            Stopping of antibiotics       strongly encouraged.               strongly encouraged.    ----------------------------     ------------------------------       PCT level decrease by  PCT < 0.25 ng/mL       >= 80% from peak PCT       OR PCT 0.25 - 0.5 ng/mL          Stopping of antibiotics                                              encouraged.     Stopping of antibiotics           encouraged.    ----------------------------     ------------------------------       PCT level decrease by              PCT >= 0.25 ng/mL       < 80% from peak PCT        AND PCT >= 0.5 ng/mL            Continuin g antibiotics                                              encouraged.       Continuing antibiotics            encouraged.    ----------------------------     ------------------------------     PCT level increase compared          PCT > 0.5 ng/mL         with peak PCT AND          PCT >= 0.5 ng/mL             Escalation of antibiotics                                          strongly encouraged.      Escalation of antibiotics        strongly encouraged.   Lactic acid, plasma     Status: None   Collection Time: 07/23/15  1:29 AM  Result Value Ref Range   Lactic Acid, Venous 0.8 0.5 - 1.9 mmol/L  Basic metabolic panel     Status: Abnormal   Collection Time: 07/23/15  1:29 AM  Result Value Ref Range   Sodium 136 135 - 145 mmol/L   Potassium 3.7 3.5 - 5.1 mmol/L   Chloride 106 101 - 111 mmol/L   CO2 25 22 - 32 mmol/L   Glucose, Bld 96 65 - 99 mg/dL   BUN 9 6 - 20 mg/dL   Creatinine, Ser 0.90 0.61 - 1.24 mg/dL   Calcium 8.1 (L) 8.9 - 10.3 mg/dL   GFR calc non Af Amer >60 >60 mL/min   GFR calc Af Amer >60 >60 mL/min    Comment: (NOTE) The eGFR has been calculated using the CKD EPI equation. This calculation has not been validated in all clinical situations. eGFR's persistently <60 mL/min signify possible Chronic Kidney Disease.    Anion gap 5 5 - 15  CBC     Status: Abnormal   Collection Time: 07/23/15  1:29 AM  Result Value Ref Range   WBC 15.0 (H) 4.0 - 10.5 K/uL   RBC 4.76 4.22 - 5.81 MIL/uL   Hemoglobin 14.4  13.0 - 17.0 g/dL   HCT 41.9 39.0 - 52.0 %   MCV 88.0 78.0 - 100.0 fL   MCH 30.3 26.0 - 34.0 pg   MCHC 34.4 30.0 - 36.0 g/dL   RDW 13.1 11.5 - 15.5 %   Platelets 213 150 -  400 K/uL  Glucose, capillary     Status: None   Collection Time: 07/23/15  7:26 AM  Result Value Ref Range   Glucose-Capillary 85 65 - 99 mg/dL    Ct Abdomen Pelvis W Contrast  07/22/2015  CLINICAL DATA:  Left lower quadrant pain. EXAM: CT ABDOMEN AND PELVIS WITH CONTRAST TECHNIQUE: Multidetector CT imaging of the abdomen and pelvis was performed using the standard protocol following bolus administration of intravenous contrast. CONTRAST:  178m ISOVUE-300 IOPAMIDOL (ISOVUE-300) INJECTION 61% COMPARISON:  KUB from earlier today FINDINGS: Lower chest:  No acute findings. Hepatobiliary: There is 15 mm low-attenuation mass in the right hepatic lobe on series 2, image 22 which is too small to characterize but most likely either a cyst or hemangioma. Hepatic steatosis is identified. The liver, portal vein, and gallbladder are otherwise normal. Pancreas: There is a tiny low-attenuation lesion measuring 4 mm in the pancreatic head on series 2, image 25. The attenuation in this lesion is 3 Hounsfield units most consistent with a small cyst. The remainder of the pancreas is normal in appearance. Spleen: Within normal limits in size and appearance. Adrenals/Urinary Tract: No masses identified. No evidence of hydronephrosis. Stomach/Bowel: The stomach small bowel are normal. Diverticuli are seen throughout colon, particularly in the sigmoid colon. There is fat stranding adjacent to the sigmoid colon with extraluminal gas and fluid seen on series 2, image 64 measuring 4.6 x 2.0 cm, consistent with perforated diverticulitis. There is mild associated bowel wall thickening in this region. The remainder of the colon and appendix are normal in appearance. Vascular/Lymphatic: There is a fat containing umbilical hernia. The abdominal aorta is normal. No adenopathy. Reproductive: No mass or other significant abnormality. Other: None. Musculoskeletal: Degenerative changes are seen in the SI joints. No other bony abnormalities.  IMPRESSION: 1. Perforated sigmoid diverticulitis with a 4.6 x 2.0 cm collection of extraluminal gas and air. No free air. 2. 4 mm low-attenuation mass in pancreatic head, likely a small cyst. Recommend an MRI in 1 year to ensure stability. 3. No other acute abnormalities. The findings were called to the patient's physician assistant, MPhilis Fendt Electronically Signed   By: DDorise BullionIII M.D   On: 07/22/2015 18:26   Dg Abd 2 Views  07/22/2015  CLINICAL DATA:  Left lower abdomen pain radiating to the groin EXAM: ABDOMEN - 2 VIEW COMPARISON:  None. FINDINGS: No opaque renal or ureteral calculi are noted by plain film. The bowel gas pattern is nonspecific. No bony abnormality is seen. IMPRESSION: No opaque calculi are noted.  The bowel gas pattern is nonspecific. Electronically Signed   By: PIvar DrapeM.D.   On: 07/22/2015 15:11    Review of Systems  Constitutional: Negative.   HENT: Positive for congestion (seasonal).   Eyes: Negative.   Respiratory: Negative.   Cardiovascular: Negative.   Gastrointestinal: Positive for heartburn (occasionally), nausea (some with pain) and abdominal pain (pain LLQ going to groin, some pain on both left and right side, but right side is the most severe.). Negative for diarrhea, constipation, blood in stool and melena.  Genitourinary: Negative.   Musculoskeletal:       Heel pain  Skin: Negative.   Neurological:  Positive for headaches (with seasonal allergies and sinus congestion).  Endo/Heme/Allergies: Negative.   Psychiatric/Behavioral: Negative.    Blood pressure 98/61, pulse 94, temperature 98.8 F (37.1 C), temperature source Oral, resp. rate 16, height 5' 8"  (1.727 m), weight 76.658 kg (169 lb), SpO2 94 %. Physical Exam  Constitutional: He is oriented to person, place, and time. He appears well-developed and well-nourished. No distress.  HENT:  Head: Normocephalic and atraumatic.  Nose: Nose normal.  Eyes: Right eye exhibits no discharge. Left  eye exhibits no discharge. No scleral icterus.  Neck: Normal range of motion. Neck supple. No JVD present. No tracheal deviation present. No thyromegaly present.  Cardiovascular: Normal rate, regular rhythm, normal heart sounds and intact distal pulses.   No murmur heard. Respiratory: Effort normal and breath sounds normal. No respiratory distress. He has no wheezes. He has no rales. He exhibits no tenderness.  GI: Soft. Bowel sounds are normal. He exhibits no distension and no mass. There is tenderness (Tender both sides below umbilicus, LLQ most senstive and where he notes pain is most severe.). There is no rebound and no guarding.  Musculoskeletal: He exhibits no edema.  Lymphadenopathy:    He has no cervical adenopathy.  Neurological: He is alert and oriented to person, place, and time. No cranial nerve deficit.  Skin: Skin is warm and dry. No rash noted. He is not diaphoretic. No erythema. No pallor.  Psychiatric: He has a normal mood and affect. His behavior is normal. Judgment and thought content normal.    Assessment/Plan: Diverticulitis with perforation and 4.6 x 2 cm Abscess  Plan:  We have ask IR to see and consider drainage.  Will change to Rocephin/Flagyl combination antibiotics for now, per current protocol.  Pain is much better this AM with antibiotics.  If drainage is not difficult will go forward with drain, if it appears difficult we would wait and see how he does with antibiotics.  His first episode resolved with bowel rest.    Lainy Wrobleski 07/23/2015, 7:32 AM

## 2015-07-23 NOTE — Progress Notes (Signed)
PROGRESS NOTE        PATIENT DETAILS Name: Marcus [name] Age: 60 y.o. Sex: male Date of Birth: [birth year] Admit Date: 07/22/2015 Admitting Physician Lorretta HarpXilin Niu, MD NGE:[id number]:Marcus [name] Nedra HaiLee, [name]  Brief Narrative: Patient is a 60 y.o. male with no past medical history admitted with abdominal pain, found to have diverticulitis with perforation and a small abscess.  Subjective: Continues to have left lower quadrant abdominal pain-but it is markedly better than on admission.  Assessment/Plan: Principal Problem: Perforated diverticulitis of the sigmoid colon with a small abscess: Continue bowel rest, empiric antibiotics. General surgery following, plans are for CT-guided drainage by interventional radiology.   DVT Prophylaxis: Prophylactic Lovenox   Code Status: Full code   Family Communication: None at bedside  Disposition Plan: Remain inpatient  Antimicrobial agents: Rocephin 7/19>> Flagyl 7/18>>  Procedures: None  CONSULTS:  general surgery  Time spent: 25 minutes-Greater than 50% of this time was spent in counseling, explanation of diagnosis, planning of further management, and coordination of care.  MEDICATIONS: Anti-infectives    Start     Dose/Rate Route Frequency Ordered Stop   07/23/15 1000  ciprofloxacin (CIPRO) IVPB 400 mg  Status:  Discontinued     400 mg 200 mL/hr over 60 Minutes Intravenous 2 times daily 07/22/15 2130 07/23/15 0835   07/23/15 0900  cefTRIAXone (ROCEPHIN) 2 g in dextrose 5 % 50 mL IVPB     2 g 100 mL/hr over 30 Minutes Intravenous Daily 07/23/15 0835     07/23/15 0400  metroNIDAZOLE (FLAGYL) IVPB 500 mg     500 mg 100 mL/hr over 60 Minutes Intravenous Every 8 hours 07/22/15 2130     07/22/15 2200  ciprofloxacin (CIPRO) IVPB 400 mg  Status:  Discontinued     400 mg 200 mL/hr over 60 Minutes Intravenous  Once 07/22/15 2100 07/23/15 0838   07/22/15 2115  metroNIDAZOLE (FLAGYL) IVPB 500 mg  Status:   Discontinued     500 mg 100 mL/hr over 60 Minutes Intravenous Every 8 hours 07/22/15 2100 07/22/15 2130   07/22/15 1930  ciprofloxacin (CIPRO) IVPB 400 mg     400 mg 200 mL/hr over 60 Minutes Intravenous  Once 07/22/15 1929 07/22/15 2054   07/22/15 1930  metroNIDAZOLE (FLAGYL) IVPB 500 mg     500 mg 100 mL/hr over 60 Minutes Intravenous  Once 07/22/15 1929 07/22/15 2154      Scheduled Meds: . cefTRIAXone (ROCEPHIN)  IV  2 g Intravenous Daily  . metronidazole  500 mg Intravenous Q8H  . sodium chloride  1,500 mL Intravenous Once   Continuous Infusions: . sodium chloride 100 mL/hr at 07/23/15 0625   PRN Meds:.acetaminophen **OR** acetaminophen, morphine injection, ondansetron, oxyCODONE-acetaminophen   PHYSICAL EXAM: Vital signs: Filed Vitals:   07/22/15 1932 07/22/15 1934 07/23/15 0628 07/23/15 1413  BP: 130/79 130/79 98/61 100/64  Pulse: 94 96 94 62  Temp:  99.9 F (37.7 C) 98.8 F (37.1 C) 97.6 F (36.4 C)  TempSrc:  Oral Oral Oral  Resp:  16 16 16   Height:  5\' 8"  (1.727 m)    Weight:  76.658 kg (169 lb)    SpO2: 92% 96% 94% 96%   Filed Weights   07/22/15 1934  Weight: 76.658 kg (169 lb)   Body mass index is 25.7 kg/(m^2).   Gen Exam: Awake and alert with clear speech. Not in  any distress  Neck: Supple, No JVD.   Chest: B/L Clear.   CVS: S1 S2 Regular, no murmurs.  Abdomen: soft, BS +, Tender left lower quadrant without any peritoneal signs. Extremities: no edema, lower extremities warm to touch. Neurologic: Non Focal.   Skin: No Rash or lesions   Wounds: N/A.    LABORATORY DATA: CBC:  Recent Labs Lab 07/22/15 1514 07/23/15 0129  WBC 14.8* 15.0*  HGB 16.4 14.4  HCT 46.4 41.9  MCV 88.8 88.0  PLT  --  213    Basic Metabolic Panel:  Recent Labs Lab 07/22/15 1451 07/22/15 1936 07/23/15 0129  NA 140 135 136  K 5.0 3.8 3.7  CL 101 101 106  CO2 GLUCOSE 89 98 96  BUN CREATININE 1.07 0.95 0.90  CALCIUM 9.5 9.2 8.1*     GFR: Estimated Creatinine Clearance: 85.5 mL/min (by C-G formula based on Cr of 0.9).  Liver Function Tests:  Recent Labs Lab 07/22/15 1451  AST 17  ALT 23  ALKPHOS 46  BILITOT 1.3*  PROT 7.0  ALBUMIN 4.4    Recent Labs Lab 07/22/15 1451  LIPASE 20   No results for input(s): AMMONIA in the last 168 hours.  Coagulation Profile:  Recent Labs Lab 07/22/15 2217  INR 1.26    Cardiac Enzymes: No results for input(s): CKTOTAL, CKMB, CKMBINDEX, TROPONINI in the last 168 hours.  BNP (last 3 results) No results for input(s): PROBNP in the last 8760 hours.  HbA1C: No results for input(s): HGBA1C in the last 72 hours.  CBG:  Recent Labs Lab 07/23/15 0726  GLUCAP 85    Lipid Profile: No results for input(s): CHOL, HDL, LDLCALC, TRIG, CHOLHDL, LDLDIRECT in the last 72 hours.  Thyroid Function Tests: No results for input(s): TSH, T4TOTAL, FREET4, T3FREE, THYROIDAB in the last 72 hours.  Anemia Panel: No results for input(s): VITAMINB12, FOLATE, FERRITIN, TIBC, IRON, RETICCTPCT in the last 72 hours.  Urine analysis:    Component Value Date/Time   BILIRUBINUR negative 07/22/2015 1458   KETONESUR negative 07/22/2015 1458   PROTEINUR negative 07/22/2015 1458   UROBILINOGEN 0.2 07/22/2015 1458   NITRITE Negative 07/22/2015 1458   LEUKOCYTESUR Negative 07/22/2015 1458    Sepsis Labs: Lactic Acid, Venous    Component Value Date/Time   LATICACIDVEN 0.8 07/23/2015 0129    MICROBIOLOGY: Recent Results (from the past 240 hour(s))  Culture, blood (x 2)     Status: None (Preliminary result)   Collection Time: 07/22/15 10:17 PM  Result Value Ref Range Status   Specimen Description RIGHT ANTECUBITAL  Final   Special Requests BOTTLES DRAWN AEROBIC AND ANAEROBIC 5CC  Final   Culture   Final    NO GROWTH < 24 HOURS Performed at [facility]    Report Status PENDING  Incomplete  Culture, blood (x 2)     Status: None (Preliminary result)   Collection  Time: 07/22/15 10:17 PM  Result Value Ref Range Status   Specimen Description BLOOD LEFT HAND  Final   Special Requests IN PEDIATRIC BOTTLE 1.5CC  Final   Culture   Final    NO GROWTH < 24 HOURS Performed at [facility]    Report Status PENDING  Incomplete    RADIOLOGY STUDIES/RESULTS: Ct Abdomen Pelvis W Contrast  07/22/2015  CLINICAL DATA:  Left lower quadrant pain. EXAM: CT ABDOMEN AND PELVIS WITH CONTRAST TECHNIQUE: Multidetector CT imaging of the abdomen and pelvis was performed  using the standard protocol following bolus administration of intravenous contrast. CONTRAST:  ISOVUE-300 IOPAMIDOL (ISOVUE-300) INJECTION 61% COMPARISON:  KUB from earlier today FINDINGS: Lower chest:  No acute findings. Hepatobiliary: There is 15 mm low-attenuation mass in the right hepatic lobe on series 2, image 22 which is too small to characterize but most likely either a cyst or hemangioma. Hepatic steatosis is identified. The liver, portal vein, and gallbladder are otherwise normal. Pancreas: There is a tiny low-attenuation lesion measuring 4 mm in the pancreatic head on series 2, image 25. The attenuation in this lesion is 3 Hounsfield units most consistent with a small cyst. The remainder of the pancreas is normal in appearance. Spleen: Within normal limits in size and appearance. Adrenals/Urinary Tract: No masses identified. No evidence of hydronephrosis. Stomach/Bowel: The stomach small bowel are normal. Diverticuli are seen throughout colon, particularly in the sigmoid colon. There is fat stranding adjacent to the sigmoid colon with extraluminal gas and fluid seen on series 2, image 64 measuring 4.6 x 2.0 cm, consistent with perforated diverticulitis. There is mild associated bowel wall thickening in this region. The remainder of the colon and appendix are normal in appearance. Vascular/Lymphatic: There is a fat containing umbilical hernia. The abdominal aorta is normal. No adenopathy.  Reproductive: No mass or other significant abnormality. Other: None. Musculoskeletal: Degenerative changes are seen in the SI joints. No other bony abnormalities. IMPRESSION: 1. Perforated sigmoid diverticulitis with a 4.6 x 2.0 cm collection of extraluminal gas and air. No free air. 2. 4 mm low-attenuation mass in pancreatic head, likely a small cyst. Recommend an MRI in 1 year to ensure stability. 3. No other acute abnormalities. The findings were called to the patient's physician assistant, Deliah Boston. Electronically Signed   By: Gerome Sam III M.D   On: 07/22/2015 18:26   Dg Abd 2 Views  07/22/2015  CLINICAL DATA:  Left lower abdomen pain radiating to the groin EXAM: ABDOMEN - 2 VIEW COMPARISON:  None. FINDINGS: No opaque renal or ureteral calculi are noted by plain film. The bowel gas pattern is nonspecific. No bony abnormality is seen. IMPRESSION: No opaque calculi are noted.  The bowel gas pattern is nonspecific. Electronically Signed   By: Dwyane Dee M.D.   On: 07/22/2015 15:11     LOS: 1 day   Jeoffrey Massed, MD  [facility] Pager:[phone]  If 7PM-7AM, please contact night-coverage [email or web address] Password [email or web address] 07/23/2015, 3:40 PM

## 2015-07-24 ENCOUNTER — Inpatient Hospital Stay (HOSPITAL_COMMUNITY): Payer: BLUE CROSS/BLUE SHIELD

## 2015-07-24 LAB — CBC
HEMATOCRIT: 38.7 % — AB (ref 39.0–52.0)
Hemoglobin: 13.3 g/dL (ref 13.0–17.0)
MCH: 30.6 pg (ref 26.0–34.0)
MCHC: 34.4 g/dL (ref 30.0–36.0)
MCV: 89 fL (ref 78.0–100.0)
PLATELETS: 215 10*3/uL (ref 150–400)
RBC: 4.35 MIL/uL (ref 4.22–5.81)
RDW: 13.2 % (ref 11.5–15.5)
WBC: 11.7 10*3/uL — AB (ref 4.0–10.5)

## 2015-07-24 LAB — GLUCOSE, CAPILLARY: Glucose-Capillary: 79 mg/dL (ref 65–99)

## 2015-07-24 MED ORDER — MIDAZOLAM HCL 2 MG/2ML IJ SOLN
INTRAMUSCULAR | Status: AC
  Filled 2015-07-24: qty 6

## 2015-07-24 MED ORDER — FENTANYL CITRATE (PF) 100 MCG/2ML IJ SOLN
INTRAMUSCULAR | Status: AC
  Filled 2015-07-24: qty 4

## 2015-07-24 MED ORDER — MIDAZOLAM HCL 2 MG/2ML IJ SOLN
INTRAMUSCULAR | Status: AC | PRN
  Administered 2015-07-24 (×2): 0.5 mg via INTRAVENOUS
  Administered 2015-07-24 (×2): 1 mg via INTRAVENOUS

## 2015-07-24 MED ORDER — FENTANYL CITRATE (PF) 100 MCG/2ML IJ SOLN
INTRAMUSCULAR | Status: AC | PRN
  Administered 2015-07-24: 50 ug via INTRAVENOUS

## 2015-07-24 NOTE — Procedures (Signed)
LLQ abscess drain. 10 Fr Pus No comp/EBL

## 2015-07-24 NOTE — Progress Notes (Signed)
Chief Complaint: Patient was seen in consultation today for drainage of abdominal abscess at the request of Dr. Jaclynn Guarneri  Referring Physician(s): *Dr. Ria Clock  Supervising Physician: Jolaine Click  Patient Status: Inpatient  History of Present Illness: Marcus [name] is a 60 y.o. male admitted with diverticulitis and LLQ abscess. He's been on abx but still not feeling a whole lot better. IR is requested to perc drain the abscess. Chart, PMHx, imaging, labs, meds, allergies reviewed. Wife at bedside. Pt has been NPO. Feels ok, still some pain, no N/V.  Past Medical History  Diagnosis Date  . Diverticulitis     Past Surgical History  Procedure Laterality Date  . Colonoscopy      Allergies: Review of patient's allergies indicates no known allergies.  Medications:  Current facility-administered medications:  .  0.9 %  sodium chloride infusion, , Intravenous, Continuous, Lorretta Harp, MD, Last Rate: 100 mL/hr at 07/24/15 6213 .  acetaminophen (TYLENOL) tablet 650 mg, 650 mg, Oral, Q6H PRN **OR** acetaminophen (TYLENOL) suppository 650 mg, 650 mg, Rectal, Q6H PRN, Lorretta Harp, MD .  cefTRIAXone (ROCEPHIN) 2 g in dextrose 5 % 50 mL IVPB, 2 g, Intravenous, Daily, Sherrie George, PA-C, 2 g at 07/24/15 1038 .  metroNIDAZOLE (FLAGYL) IVPB 500 mg, 500 mg, Intravenous, Q8H, Drew A Wofford, RPH, 500 mg at 07/24/15 0316 .  morphine 2 MG/ML injection 2 mg, 2 mg, Intravenous, Q4H PRN, Lorretta Harp, MD .  ondansetron [facility]) injection 4 mg, 4 mg, Intravenous, Q8H PRN, Lorretta Harp, MD .  oxyCODONE-acetaminophen (PERCOCET/ROXICET) 5-325 MG per tablet 2 tablet, 2 tablet, Oral, Q6H PRN, Lorretta Harp, MD, 2 tablet at 07/24/15 1038 .  sodium chloride 0.9 % bolus 1,500 mL, 1,500 mL, Intravenous, Once, Lorretta Harp, MD, 1,500 mL at 07/22/15 2209    Family History  Problem Relation Age of Onset  . Diverticulitis Mother   . Urolithiasis Father   . Hypertension Father   . Hypertension Brother    . Depression Brother     Social History   Social History  . Marital Status: Married    Spouse Name: N/A  . Number of Children: N/A  . Years of Education: N/A   Social History Main Topics  . Smoking status: Never Smoker   . Smokeless tobacco: None  . Alcohol Use: No  . Drug Use: No  . Sexual Activity: Not Asked   Other Topics Concern  . None   Social History Narrative     Review of Systems: A 12 point ROS discussed and pertinent positives are indicated in the HPI above.  All other systems are negative.  Review of Systems  Vital Signs: BP 97/56 mmHg  Pulse 66  Temp(Src) 98.2 F (36.8 C) (Oral)  Resp 17  Ht  (1.727 m)  Wt 169 lb (76.658 kg)  BMI 25.70 kg/m2  SpO2 94%  Physical Exam  Constitutional: He is oriented to person, place, and time. He appears well-developed and well-nourished. No distress.  HENT:  Head: Normocephalic.  Mouth/Throat: Oropharynx is clear and moist.  Neck: Normal range of motion. No tracheal deviation present.  Cardiovascular: Normal rate, regular rhythm and normal heart sounds.   Pulmonary/Chest: Effort normal and breath sounds normal. No respiratory distress. He has no wheezes. He has no rales.  Abdominal: Soft. He exhibits no mass. There is tenderness. There is no rebound and no guarding.  LLQ tender  Neurological: He is alert and oriented to person, place, and time.  Skin: He  is not diaphoretic.  Psychiatric: He has a normal mood and affect. Judgment normal.    Mallampati Score:  MD Evaluation Airway: WNL Heart: WNL Abdomen: WNL Chest/ Lungs: WNL ASA  Classification: 2 Mallampati/Airway Score: One  Imaging: Ct Abdomen Pelvis W Contrast  07/22/2015  CLINICAL DATA:  Left lower quadrant pain. EXAM: CT ABDOMEN AND PELVIS WITH CONTRAST TECHNIQUE: Multidetector CT imaging of the abdomen and pelvis was performed using the standard protocol following bolus administration of intravenous contrast. CONTRAST:  100mL ISOVUE-300  IOPAMIDOL (ISOVUE-300) INJECTION 61% COMPARISON:  KUB from earlier today FINDINGS: Lower chest:  No acute findings. Hepatobiliary: There is 15 mm low-attenuation mass in the right hepatic lobe on series 2, image 22 which is too small to characterize but most likely either a cyst or hemangioma. Hepatic steatosis is identified. The liver, portal vein, and gallbladder are otherwise normal. Pancreas: There is a tiny low-attenuation lesion measuring 4 mm in the pancreatic head on series 2, image 25. The attenuation in this lesion is 3 Hounsfield units most consistent with a small cyst. The remainder of the pancreas is normal in appearance. Spleen: Within normal limits in size and appearance. Adrenals/Urinary Tract: No masses identified. No evidence of hydronephrosis. Stomach/Bowel: The stomach small bowel are normal. Diverticuli are seen throughout colon, particularly in the sigmoid colon. There is fat stranding adjacent to the sigmoid colon with extraluminal gas and fluid seen on series 2, image 64 measuring 4.6 x 2.0 cm, consistent with perforated diverticulitis. There is mild associated bowel wall thickening in this region. The remainder of the colon and appendix are normal in appearance. Vascular/Lymphatic: There is a fat containing umbilical hernia. The abdominal aorta is normal. No adenopathy. Reproductive: No mass or other significant abnormality. Other: None. Musculoskeletal: Degenerative changes are seen in the SI joints. No other bony abnormalities. IMPRESSION: 1. Perforated sigmoid diverticulitis with a 4.6 x 2.0 cm collection of extraluminal gas and air. No free air. 2. 4 mm low-attenuation mass in pancreatic head, likely a small cyst. Recommend an MRI in 1 year to ensure stability. 3. No other acute abnormalities. The findings were called to the patient's physician assistant, Deliah BostonMichael Clark. Electronically Signed   By: Gerome Samavid  Williams III M.D   On: 07/22/2015 18:26   Dg Abd 2 Views  07/22/2015  CLINICAL  DATA:  Left lower abdomen pain radiating to the groin EXAM: ABDOMEN - 2 VIEW COMPARISON:  None. FINDINGS: No opaque renal or ureteral calculi are noted by plain film. The bowel gas pattern is nonspecific. No bony abnormality is seen. IMPRESSION: No opaque calculi are noted.  The bowel gas pattern is nonspecific. Electronically Signed   By: Dwyane DeePaul  Barry M.D.   On: 07/22/2015 15:11    Labs:  CBC:  Recent Labs  07/22/15 1514 07/23/15 0129 07/24/15 0522  WBC 14.8* 15.0* 11.7*  HGB 16.4 14.4 13.3  HCT 46.4 41.9 38.7*  PLT  --  213 215    COAGS:  Recent Labs  07/22/15 2217  INR 1.26  APTT 29    BMP:  Recent Labs  07/22/15 1451 07/22/15 1936 07/23/15 0129  NA 140 135 136  K 5.0 3.8 3.7  CL 101 101 106  CO2 26 23 25   GLUCOSE 89 98 96  BUN 11 10 9   CALCIUM 9.5 9.2 8.1*  CREATININE 1.07 0.95 0.90  GFRNONAA 76 >60 >60  GFRAA 87 >60 >60    LIVER FUNCTION TESTS:  Recent Labs  07/22/15 1451  BILITOT 1.3*  AST 17  ALT 23  ALKPHOS 46  PROT 7.0  ALBUMIN 4.4    TUMOR MARKERS: No results for input(s): AFPTM, CEA, CA199, CHROMGRNA in the last 8760 hours.  Assessment and Plan: Diverticulitis with LLQ abscess Imaging reviewed by Dr. Bonnielee Haff, can attempt CT guided perc drainage, though perc window is small. Labs reviewed. Risks and Benefits discussed with the patient including bleeding, infection, damage to adjacent structures, bowel perforation/fistula connection, and sepsis. All of the patient's questions were answered, patient is agreeable to proceed. Consent signed and in chart.    Thank you for this interesting consult.  I greatly enjoyed meeting Jada Kuhnert and look forward to participating in their care.  A copy of this report was sent to the requesting provider on this date.  Electronically Signed: Brayton El 07/24/2015, 11:27 AM   I spent a total of 20 minutes in face to face in clinical consultation, greater than 50% of which was  counseling/coordinating care for drainage of LLQ abscess

## 2015-07-24 NOTE — Progress Notes (Signed)
PROGRESS NOTE        PATIENT DETAILS Name: Marcus [name] Age: 60 y.o. Sex: male Date of Birth: Oct 31, 1955 Admit Date: 07/22/2015 Admitting Physician Lorretta Harp, MD ZOX:[id number] Nedra Hai, PA-C  Brief Narrative: Patient is a 60 y.o. male with no past medical history admitted with abdominal pain, found to have diverticulitis with perforation and a small abscess.  Subjective: Seen while ambulating in the hallway. He feels better. He is scheduled for CT-guided drainage of his left lower quadrant abdominal abscess later this afternoon.   Assessment/Plan: Principal Problem: Perforated diverticulitis of the sigmoid colon with a small abscess: Continue bowel rest, empiric antibiotics. General surgery following, plans are for CT-guided drainage by interventional radiology today.   DVT Prophylaxis: Prophylactic Lovenox   Code Status: Full code   Family Communication: None at bedside  Disposition Plan: Remain inpatient  Antimicrobial agents: Rocephin 7/19>> Flagyl 7/18>>  Procedures: None  CONSULTS:  general surgery  Time spent: 25 minutes-Greater than 50% of this time was spent in counseling, explanation of diagnosis, planning of further management, and coordination of care.  MEDICATIONS: Anti-infectives    Start     Dose/Rate Route Frequency Ordered Stop   07/23/15 1000  ciprofloxacin (CIPRO) IVPB 400 mg  Status:  Discontinued     400 mg 200 mL/hr over 60 Minutes Intravenous 2 times daily 07/22/15 2130 07/23/15 0835   07/23/15 0900  cefTRIAXone (ROCEPHIN) 2 g in dextrose 5 % 50 mL IVPB     2 g 100 mL/hr over 30 Minutes Intravenous Daily 07/23/15 0835     07/23/15 0400  metroNIDAZOLE (FLAGYL) IVPB 500 mg     500 mg 100 mL/hr over 60 Minutes Intravenous Every 8 hours 07/22/15 2130     07/22/15 2200  ciprofloxacin (CIPRO) IVPB 400 mg  Status:  Discontinued     400 mg 200 mL/hr over 60 Minutes Intravenous  Once 07/22/15 2100 07/23/15 0838   07/22/15 2115  metroNIDAZOLE (FLAGYL) IVPB 500 mg  Status:  Discontinued     500 mg 100 mL/hr over 60 Minutes Intravenous Every 8 hours 07/22/15 2100 07/22/15 2130   07/22/15 1930  ciprofloxacin (CIPRO) IVPB 400 mg     400 mg 200 mL/hr over 60 Minutes Intravenous  Once 07/22/15 1929 07/22/15 2054   07/22/15 1930  metroNIDAZOLE (FLAGYL) IVPB 500 mg     500 mg 100 mL/hr over 60 Minutes Intravenous  Once 07/22/15 1929 07/22/15 2154      Scheduled Meds: . cefTRIAXone (ROCEPHIN)  IV  2 g Intravenous Daily  . metronidazole  500 mg Intravenous Q8H  . sodium chloride  1,500 mL Intravenous Once   Continuous Infusions: . sodium chloride 100 mL/hr at 07/24/15 0638   PRN Meds:.acetaminophen **OR** acetaminophen, morphine injection, ondansetron, oxyCODONE-acetaminophen   PHYSICAL EXAM: Vital signs: Filed Vitals:   07/23/15 0628 07/23/15 1413 07/23/15 2300 07/24/15 0635  BP: 98/61 100/64 97/51 97/56   Pulse: 94 62 63 66  Temp: 98.8 F (37.1 C) 97.6 F (36.4 C) 98.1 F (36.7 C) 98.2 F (36.8 C)  TempSrc: Oral Oral Oral Oral  Resp: 16 16 17 17   Height:      Weight:      SpO2: 94% 96% 95% 94%   Filed Weights   07/22/15 1934  Weight: 76.658 kg (169 lb)   Body mass index is 25.7 kg/(m^2).   Gen Exam:  Awake and alert with clear speech. Not in any distress  Neck: Supple, No JVD.   Chest: B/L Clear.   CVS: S1 S2 Regular, no murmurs.  Abdomen: soft, BS +, Tender left lower quadrant without any peritoneal signs. Extremities: no edema, lower extremities warm to touch. Neurologic: Non Focal.   Skin: No Rash or lesions   Wounds: N/A.    LABORATORY DATA: CBC:  Recent Labs Lab 07/22/15 1514 07/23/15 0129 07/24/15 0522  WBC 14.8* 15.0* 11.7*  HGB 16.4 14.4 13.3  HCT 46.4 41.9 38.7*  MCV 88.8 88.0 89.0  PLT  --  213 215    Basic Metabolic Panel:  Recent Labs Lab 07/22/15 1451 07/22/15 1936 07/23/15 0129  NA 140 135 136  K 5.0 3.8 3.7  CL 101 101 106  CO2 GLUCOSE 89 98 96  BUN CREATININE 1.07 0.95 0.90  CALCIUM 9.5 9.2 8.1*    GFR: Estimated Creatinine Clearance: 85.5 mL/min (by C-G formula based on Cr of 0.9).  Liver Function Tests:  Recent Labs Lab 07/22/15 1451  AST 17  ALT 23  ALKPHOS 46  BILITOT 1.3*  PROT 7.0  ALBUMIN 4.4    Recent Labs Lab 07/22/15 1451  LIPASE 20   No results for input(s): AMMONIA in the last 168 hours.  Coagulation Profile:  Recent Labs Lab 07/22/15 2217  INR 1.26    Cardiac Enzymes: No results for input(s): CKTOTAL, CKMB, CKMBINDEX, TROPONINI in the last 168 hours.  BNP (last 3 results) No results for input(s): PROBNP in the last 8760 hours.  HbA1C: No results for input(s): HGBA1C in the last 72 hours.  CBG:  Recent Labs Lab 07/23/15 0726 07/24/15 0743  GLUCAP 85 79    Lipid Profile: No results for input(s): CHOL, HDL, LDLCALC, TRIG, CHOLHDL, LDLDIRECT in the last 72 hours.  Thyroid Function Tests: No results for input(s): TSH, T4TOTAL, FREET4, T3FREE, THYROIDAB in the last 72 hours.  Anemia Panel: No results for input(s): VITAMINB12, FOLATE, FERRITIN, TIBC, IRON, RETICCTPCT in the last 72 hours.  Urine analysis:    Component Value Date/Time   BILIRUBINUR negative 07/22/2015 1458   KETONESUR negative 07/22/2015 1458   PROTEINUR negative 07/22/2015 1458   UROBILINOGEN 0.2 07/22/2015 1458   NITRITE Negative 07/22/2015 1458   LEUKOCYTESUR Negative 07/22/2015 1458    Sepsis Labs: Lactic Acid, Venous    Component Value Date/Time   LATICACIDVEN 0.8 07/23/2015 0129    MICROBIOLOGY: Recent Results (from the past 240 hour(s))  Culture, blood (x 2)     Status: None (Preliminary result)   Collection Time: 07/22/15 10:17 PM  Result Value Ref Range Status   Specimen Description RIGHT ANTECUBITAL  Final   Special Requests BOTTLES DRAWN AEROBIC AND ANAEROBIC 5CC  Final   Culture   Final    NO GROWTH < 24 HOURS Performed at [facility]    Report  Status PENDING  Incomplete  Culture, blood (x 2)     Status: None (Preliminary result)   Collection Time: 07/22/15 10:17 PM  Result Value Ref Range Status   Specimen Description BLOOD LEFT HAND  Final   Special Requests IN PEDIATRIC BOTTLE 1.5CC  Final   Culture   Final    NO GROWTH < 24 HOURS Performed at [facility]    Report Status PENDING  Incomplete    RADIOLOGY STUDIES/RESULTS: Ct Abdomen Pelvis W Contrast  07/22/2015  CLINICAL DATA:  Left lower quadrant  pain. EXAM: CT ABDOMEN AND PELVIS WITH CONTRAST TECHNIQUE: Multidetector CT imaging of the abdomen and pelvis was performed using the standard protocol following bolus administration of intravenous contrast. CONTRAST:  100mL ISOVUE-300 IOPAMIDOL (ISOVUE-300) INJECTION 61% COMPARISON:  KUB from earlier today FINDINGS: Lower chest:  No acute findings. Hepatobiliary: There is 15 mm low-attenuation mass in the right hepatic lobe on series 2, image 22 which is too small to characterize but most likely either a cyst or hemangioma. Hepatic steatosis is identified. The liver, portal vein, and gallbladder are otherwise normal. Pancreas: There is a tiny low-attenuation lesion measuring 4 mm in the pancreatic head on series 2, image 25. The attenuation in this lesion is 3 Hounsfield units most consistent with a small cyst. The remainder of the pancreas is normal in appearance. Spleen: Within normal limits in size and appearance. Adrenals/Urinary Tract: No masses identified. No evidence of hydronephrosis. Stomach/Bowel: The stomach small bowel are normal. Diverticuli are seen throughout colon, particularly in the sigmoid colon. There is fat stranding adjacent to the sigmoid colon with extraluminal gas and fluid seen on series 2, image 64 measuring 4.6 x 2.0 cm, consistent with perforated diverticulitis. There is mild associated bowel wall thickening in this region. The remainder of the colon and appendix are normal in appearance. Vascular/Lymphatic:  There is a fat containing umbilical hernia. The abdominal aorta is normal. No adenopathy. Reproductive: No mass or other significant abnormality. Other: None. Musculoskeletal: Degenerative changes are seen in the SI joints. No other bony abnormalities. IMPRESSION: 1. Perforated sigmoid diverticulitis with a 4.6 x 2.0 cm collection of extraluminal gas and air. No free air. 2. 4 mm low-attenuation mass in pancreatic head, likely a small cyst. Recommend an MRI in 1 year to ensure stability. 3. No other acute abnormalities. The findings were called to the patient's physician assistant, Deliah BostonMichael Clark. Electronically Signed   By: Gerome Samavid  Williams III M.D   On: 07/22/2015 18:26   Dg Abd 2 Views  07/22/2015  CLINICAL DATA:  Left lower abdomen pain radiating to the groin EXAM: ABDOMEN - 2 VIEW COMPARISON:  None. FINDINGS: No opaque renal or ureteral calculi are noted by plain film. The bowel gas pattern is nonspecific. No bony abnormality is seen. IMPRESSION: No opaque calculi are noted.  The bowel gas pattern is nonspecific. Electronically Signed   By: Dwyane DeePaul  Barry M.D.   On: 07/22/2015 15:11     LOS: 2 days   Jeoffrey MassedGHIMIRE,Mykhia Danish, MD  [facility] Pager:[phone]  If 7PM-7AM, please contact night-coverage [email or web address] Password [email or web address] 07/24/2015, 12:28 PM

## 2015-07-24 NOTE — Progress Notes (Signed)
Patient ID: Marcus MarsRichard Winchell, male   DOB: 1955/08/20, 60 y.o.   MRN: [id number]    Subjective: Did not feel quite as well last night. Had some generalized abdominal cramping and uneasiness. Had a small bowel movement. Left lower quadrant pain is still present but much improved from admission.  Objective: Vital signs in last 24 hours: Temp:  [97.6 F (36.4 C)-98.2 F (36.8 C)] 98.2 F (36.8 C) (07/20 0635) Pulse Rate:  [62-66] 66 (07/20 0635) Resp:  [16-17] 17 (07/20 0635) BP: (97-100)/(51-64) 97/56 mmHg (07/20 0635) SpO2:  [94 %-96 %] 94 % (07/20 0635) Last BM Date: 07/21/15  Intake/Output from previous day:   Intake/Output this shift:    General appearance: alert, cooperative and no distress GI: mild to moderate localized left lower quadrant tenderness without guarding. No palpable mass. Remainder of abdomen is soft and nontender.  Lab Results:   Recent Labs  07/23/15 0129 07/24/15 0522  WBC 15.0* 11.7*  HGB 14.4 13.3  HCT 41.9 38.7*  PLT 213 215   BMET  Recent Labs  07/22/15 1936 07/23/15 0129  NA 135 136  K 3.8 3.7  CL 101 106  CO2 23 25  GLUCOSE 98 96  BUN 10 9  CREATININE 0.95 0.90  CALCIUM 9.2 8.1*     Studies/Results: Ct Abdomen Pelvis W Contrast  07/22/2015  CLINICAL DATA:  Left lower quadrant pain. EXAM: CT ABDOMEN AND PELVIS WITH CONTRAST TECHNIQUE: Multidetector CT imaging of the abdomen and pelvis was performed using the standard protocol following bolus administration of intravenous contrast. CONTRAST:  100mL ISOVUE-300 IOPAMIDOL (ISOVUE-300) INJECTION 61% COMPARISON:  KUB from earlier today FINDINGS: Lower chest:  No acute findings. Hepatobiliary: There is 15 mm low-attenuation mass in the right hepatic lobe on series 2, image 22 which is too small to characterize but most likely either a cyst or hemangioma. Hepatic steatosis is identified. The liver, portal vein, and gallbladder are otherwise normal. Pancreas: There is a tiny low-attenuation  lesion measuring 4 mm in the pancreatic head on series 2, image 25. The attenuation in this lesion is 3 Hounsfield units most consistent with a small cyst. The remainder of the pancreas is normal in appearance. Spleen: Within normal limits in size and appearance. Adrenals/Urinary Tract: No masses identified. No evidence of hydronephrosis. Stomach/Bowel: The stomach small bowel are normal. Diverticuli are seen throughout colon, particularly in the sigmoid colon. There is fat stranding adjacent to the sigmoid colon with extraluminal gas and fluid seen on series 2, image 64 measuring 4.6 x 2.0 cm, consistent with perforated diverticulitis. There is mild associated bowel wall thickening in this region. The remainder of the colon and appendix are normal in appearance. Vascular/Lymphatic: There is a fat containing umbilical hernia. The abdominal aorta is normal. No adenopathy. Reproductive: No mass or other significant abnormality. Other: None. Musculoskeletal: Degenerative changes are seen in the SI joints. No other bony abnormalities. IMPRESSION: 1. Perforated sigmoid diverticulitis with a 4.6 x 2.0 cm collection of extraluminal gas and air. No free air. 2. 4 mm low-attenuation mass in pancreatic head, likely a small cyst. Recommend an MRI in 1 year to ensure stability. 3. No other acute abnormalities. The findings were called to the patient's physician assistant, Deliah BostonMichael Clark. Electronically Signed   By: Gerome Samavid  Williams III M.D   On: 07/22/2015 18:26   Dg Abd 2 Views  07/22/2015  CLINICAL DATA:  Left lower abdomen pain radiating to the groin EXAM: ABDOMEN - 2 VIEW COMPARISON:  None. FINDINGS: No opaque renal  or ureteral calculi are noted by plain film. The bowel gas pattern is nonspecific. No bony abnormality is seen. IMPRESSION: No opaque calculi are noted.  The bowel gas pattern is nonspecific. Electronically Signed   By: Dwyane Dee M.D.   On: 07/22/2015 15:11    Anti-infectives: Anti-infectives    Start      Dose/Rate Route Frequency Ordered Stop   07/23/15 1000  ciprofloxacin (CIPRO) IVPB 400 mg  Status:  Discontinued     400 mg 200 mL/hr over 60 Minutes Intravenous 2 times daily 07/22/15 2130 07/23/15 0835   07/23/15 0900  cefTRIAXone (ROCEPHIN) 2 g in dextrose 5 % 50 mL IVPB     2 g 100 mL/hr over 30 Minutes Intravenous Daily 07/23/15 0835     07/23/15 0400  metroNIDAZOLE (FLAGYL) IVPB 500 mg     500 mg 100 mL/hr over 60 Minutes Intravenous Every 8 hours 07/22/15 2130     07/22/15 2200  ciprofloxacin (CIPRO) IVPB 400 mg  Status:  Discontinued     400 mg 200 mL/hr over 60 Minutes Intravenous  Once 07/22/15 2100 07/23/15 0838   07/22/15 2115  metroNIDAZOLE (FLAGYL) IVPB 500 mg  Status:  Discontinued     500 mg 100 mL/hr over 60 Minutes Intravenous Every 8 hours 07/22/15 2100 07/22/15 2130   07/22/15 1930  ciprofloxacin (CIPRO) IVPB 400 mg     400 mg 200 mL/hr over 60 Minutes Intravenous  Once 07/22/15 1929 07/22/15 2054   07/22/15 1930  metroNIDAZOLE (FLAGYL) IVPB 500 mg     500 mg 100 mL/hr over 60 Minutes Intravenous  Once 07/22/15 1929 07/22/15 2154      Assessment/Plan: Sigmoid diverticulitis with pericolonic abscess. Improved from admission. Continue IV antibiotics. Interventional radiology to evaluate today for possible drain placement.     LOS: 2 days    Austine Kelsay T 07/24/2015

## 2015-07-25 DIAGNOSIS — K5720 Diverticulitis of large intestine with perforation and abscess without bleeding: Principal | ICD-10-CM

## 2015-07-25 LAB — GLUCOSE, CAPILLARY: Glucose-Capillary: 85 mg/dL (ref 65–99)

## 2015-07-25 MED ORDER — ENOXAPARIN SODIUM 40 MG/0.4ML ~~LOC~~ SOLN
40.0000 mg | Freq: Every day | SUBCUTANEOUS | Status: DC
  Administered 2015-07-25 – 2015-07-28 (×4): 40 mg via SUBCUTANEOUS
  Filled 2015-07-25 (×4): qty 0.4

## 2015-07-25 NOTE — Progress Notes (Signed)
ANTICOAGULATION CONSULT NOTE  Pharmacy Consult for Lovenox Indication: VTE prophylaxis  No Known Allergies  Patient Measurements: Height: 5\' 8"  (172.7 cm) Weight: 169 lb (76.658 kg) IBW/kg (Calculated) : 68.4  Vital Signs: Temp: 98.5 F (36.9 C) (07/21 0449) Temp Source: Oral (07/21 0449) BP: 106/69 mmHg (07/21 0449) Pulse Rate: 64 (07/21 0449)  Labs:  Recent Labs  07/22/15 1451 07/22/15 1514 07/22/15 1936 07/22/15 2217 07/23/15 0129 07/24/15 0522  HGB  --  16.4  --   --  14.4 13.3  HCT  --  46.4  --   --  41.9 38.7*  PLT  --   --   --   --  213 215  APTT  --   --   --  29  --   --   LABPROT  --   --   --  15.9*  --   --   INR  --   --   --  1.26  --   --   CREATININE 1.07  --  0.95  --  0.90  --     Estimated Creatinine Clearance: 85.5 mL/min (by C-G formula based on Cr of 0.9).   Medical History: Past Medical History  Diagnosis Date  . Diverticulitis     Medications:  Prescriptions prior to admission  Medication Sig Dispense Refill Last Dose  . acetaminophen (TYLENOL) 500 MG tablet Take 500 mg by mouth every 6 (six) hours as needed for moderate pain.   07/22/2015 at Unknown time  . ibuprofen (ADVIL,MOTRIN) 200 MG tablet Take 400 mg by mouth every 6 (six) hours as needed for moderate pain.   07/22/2015 at Unknown time   Scheduled:  . cefTRIAXone (ROCEPHIN)  IV  2 g Intravenous Daily  . metronidazole  500 mg Intravenous Q8H  . sodium chloride  1,500 mL Intravenous Once    Assessment: 60 y.o. male admitted 07/22/2015 for perforated diverticulitis with abscess.  Now stable s/p drain placement with Surgery requesting to add Lovenox to SCDs for VTE ppx.  CBC, SCr wnl.  No prior anticoagulation   Goal of Therapy: Prevention of VTE  Plan:  Lovenox 40 mg SQ q24 hr  Pharmacy to sign off   Bernadene Personrew Rakeen Gaillard, PharmD, BCPS Pager: [phone] 07/25/2015, 11:16 AM

## 2015-07-25 NOTE — Progress Notes (Addendum)
PROGRESS NOTE        PATIENT DETAILS Name: Marcus [name] Age: 60 y.o. Sex: male Date of Birth: 03/09/1955 Admit Date: 07/22/2015 Admitting Physician Lorretta Harp, MD ZOX:[id number] Nedra Hai, PA-C  Brief Narrative: Patient is a 60 y.o. male with no past medical history admitted with abdominal pain, found to have diverticulitis with perforation and a small abscess. There went CT guided Drain placement on 7/20, improving slowly.  Subjective: 2 loose stools this morning-has pain in the left lower quadrant area. One episode of vomiting yesterday.  Assessment/Plan: Principal Problem: Perforated diverticulitis of the sigmoid colon with a small abscess: Status post CT-guided drainage by interventional radiology on 7/20.Slowly improving-diet being advanced by general surgery, Continue empiric antibiotics.   Incidental finding of 4 mm low-attenuation mass in pancreatic head on CT scan abdomen: Per radiology this is likely a small cyst. Urology recommending an MRI in 1 year to ensure stability.  DVT Prophylaxis: Prophylactic Lovenox   Code Status: Full code   Family Communication: None at bedside  Disposition Plan: Remain inpatient-home likely over the weekend  Antimicrobial agents: Rocephin 7/19>> Flagyl 7/18>>  Procedures: None  CONSULTS:  general surgery  Time spent: 25 minutes-Greater than 50% of this time was spent in counseling, explanation of diagnosis, planning of further management, and coordination of care.  MEDICATIONS: Anti-infectives    Start     Dose/Rate Route Frequency Ordered Stop   07/23/15 1000  ciprofloxacin (CIPRO) IVPB 400 mg  Status:  Discontinued     400 mg 200 mL/hr over 60 Minutes Intravenous 2 times daily 07/22/15 2130 07/23/15 0835   07/23/15 0900  cefTRIAXone (ROCEPHIN) 2 g in dextrose 5 % 50 mL IVPB     2 g 100 mL/hr over 30 Minutes Intravenous Daily 07/23/15 0835     07/23/15 0400  metroNIDAZOLE (FLAGYL) IVPB 500  mg     500 mg 100 mL/hr over 60 Minutes Intravenous Every 8 hours 07/22/15 2130     07/22/15 2200  ciprofloxacin (CIPRO) IVPB 400 mg  Status:  Discontinued     400 mg 200 mL/hr over 60 Minutes Intravenous  Once 07/22/15 2100 07/23/15 0838   07/22/15 2115  metroNIDAZOLE (FLAGYL) IVPB 500 mg  Status:  Discontinued     500 mg 100 mL/hr over 60 Minutes Intravenous Every 8 hours 07/22/15 2100 07/22/15 2130   07/22/15 1930  ciprofloxacin (CIPRO) IVPB 400 mg     400 mg 200 mL/hr over 60 Minutes Intravenous  Once 07/22/15 1929 07/22/15 2054   07/22/15 1930  metroNIDAZOLE (FLAGYL) IVPB 500 mg     500 mg 100 mL/hr over 60 Minutes Intravenous  Once 07/22/15 1929 07/22/15 2154      Scheduled Meds: . cefTRIAXone (ROCEPHIN)  IV  2 g Intravenous Daily  . enoxaparin (LOVENOX) injection  40 mg Subcutaneous Daily  . metronidazole  500 mg Intravenous Q8H  . sodium chloride  1,500 mL Intravenous Once   Continuous Infusions:   PRN Meds:.acetaminophen **OR** acetaminophen, morphine injection, ondansetron, oxyCODONE-acetaminophen   PHYSICAL EXAM: Vital signs: Filed Vitals:   07/24/15 1813 07/24/15 1839 07/24/15 2156 07/25/15 0449  BP: 115/73 122/75 111/68 106/69  Pulse: 68 68 71 64  Temp: 98.6 F (37 C) 97.7 F (36.5 C) 98.8 F (37.1 C) 98.5 F (36.9 C)  TempSrc: Oral  Oral Oral  Resp: 18 18 18 20   Height:  Weight:      SpO2: 100% 97% 95% 94%   Filed Weights   07/22/15 1934  Weight: 76.658 kg (169 lb)   Body mass index is 25.7 kg/(m^2).   Gen Exam: Awake and alert with clear speech. Not in any distress  Neck: Supple, No JVD.   Chest: B/L Clear.   CVS: S1 S2 Regular, no murmurs.  Abdomen: soft, BS +, Moderately Tender left lower quadrant without any peritoneal signs. Has a left lower quadrant drain in place Extremities: no edema, lower extremities warm to touch. Neurologic: Non Focal.   Skin: No Rash or lesions   Wounds: N/A.    LABORATORY DATA: CBC:  Recent Labs Lab  07/22/15 1514 07/23/15 0129 07/24/15 0522  WBC 14.8* 15.0* 11.7*  HGB 16.4 14.4 13.3  HCT 46.4 41.9 38.7*  MCV 88.8 88.0 89.0  PLT  --  213 215    Basic Metabolic Panel:  Recent Labs Lab 07/22/15 1451 07/22/15 1936 07/23/15 0129  NA 140 135 136  K 5.0 3.8 3.7  CL 101 101 106  CO2 GLUCOSE 89 98 96  BUN CREATININE 1.07 0.95 0.90  CALCIUM 9.5 9.2 8.1*    GFR: Estimated Creatinine Clearance: 85.5 mL/min (by C-G formula based on Cr of 0.9).  Liver Function Tests:  Recent Labs Lab 07/22/15 1451  AST 17  ALT 23  ALKPHOS 46  BILITOT 1.3*  PROT 7.0  ALBUMIN 4.4    Recent Labs Lab 07/22/15 1451  LIPASE 20   No results for input(s): AMMONIA in the last 168 hours.  Coagulation Profile:  Recent Labs Lab 07/22/15 2217  INR 1.26    Cardiac Enzymes: No results for input(s): CKTOTAL, CKMB, CKMBINDEX, TROPONINI in the last 168 hours.  BNP (last 3 results) No results for input(s): PROBNP in the last 8760 hours.  HbA1C: No results for input(s): HGBA1C in the last 72 hours.  CBG:  Recent Labs Lab 07/23/15 0726 07/24/15 0743 07/25/15 0736  GLUCAP 85 79 85    Lipid Profile: No results for input(s): CHOL, HDL, LDLCALC, TRIG, CHOLHDL, LDLDIRECT in the last 72 hours.  Thyroid Function Tests: No results for input(s): TSH, T4TOTAL, FREET4, T3FREE, THYROIDAB in the last 72 hours.  Anemia Panel: No results for input(s): VITAMINB12, FOLATE, FERRITIN, TIBC, IRON, RETICCTPCT in the last 72 hours.  Urine analysis:    Component Value Date/Time   BILIRUBINUR negative 07/22/2015 1458   KETONESUR negative 07/22/2015 1458   PROTEINUR negative 07/22/2015 1458   UROBILINOGEN 0.2 07/22/2015 1458   NITRITE Negative 07/22/2015 1458   LEUKOCYTESUR Negative 07/22/2015 1458    Sepsis Labs: Lactic Acid, Venous    Component Value Date/Time   LATICACIDVEN 0.8 07/23/2015 0129    MICROBIOLOGY: Recent Results (from the past 240 hour(s))    Culture, blood (x 2)     Status: None (Preliminary result)   Collection Time: 07/22/15 10:17 PM  Result Value Ref Range Status   Specimen Description RIGHT ANTECUBITAL  Final   Special Requests BOTTLES DRAWN AEROBIC AND ANAEROBIC 5CC  Final   Culture   Final    NO GROWTH 3 DAYS Performed at [facility]    Report Status PENDING  Incomplete  Culture, blood (x 2)     Status: None (Preliminary result)   Collection Time: 07/22/15 10:17 PM  Result Value Ref Range Status   Specimen Description BLOOD LEFT HAND  Final   Special Requests IN PEDIATRIC BOTTLE 1.5CC  Final   Culture   Final    NO GROWTH 3 DAYS Performed at [facility]    Report Status PENDING  Incomplete  Aerobic/Anaerobic Culture (surgical/deep wound)     Status: None (Preliminary result)   Collection Time: 07/24/15  5:35 PM  Result Value Ref Range Status   Specimen Description ABSCESS ABDOMEN LEFT LOWER QUAD  Final   Special Requests NONE  Final   Gram Stain   Final    ABUNDANT WBC PRESENT, PREDOMINANTLY PMN NO ORGANISMS SEEN    Culture   Final    CULTURE REINCUBATED FOR BETTER GROWTH Performed [facility]    Report Status PENDING  Incomplete    RADIOLOGY STUDIES/RESULTS: Ct Abdomen Pelvis W Contrast  07/22/2015  CLINICAL DATA:  Left lower quadrant pain. EXAM: CT ABDOMEN AND PELVIS WITH CONTRAST TECHNIQUE: Multidetector CT imaging of the abdomen and pelvis was performed using the standard protocol following bolus administration of intravenous contrast. CONTRAST:  100mL ISOVUE-300 IOPAMIDOL (ISOVUE-300) INJECTION 61% COMPARISON:  KUB from earlier today FINDINGS: Lower chest:  No acute findings. Hepatobiliary: There is 15 mm low-attenuation mass in the right hepatic lobe on series 2, image 22 which is too small to characterize but most likely either a cyst or hemangioma. Hepatic steatosis is identified. The liver, portal vein, and gallbladder are otherwise normal. Pancreas: There is a tiny  low-attenuation lesion measuring 4 mm in the pancreatic head on series 2, image 25. The attenuation in this lesion is 3 Hounsfield units most consistent with a small cyst. The remainder of the pancreas is normal in appearance. Spleen: Within normal limits in size and appearance. Adrenals/Urinary Tract: No masses identified. No evidence of hydronephrosis. Stomach/Bowel: The stomach small bowel are normal. Diverticuli are seen throughout colon, particularly in the sigmoid colon. There is fat stranding adjacent to the sigmoid colon with extraluminal gas and fluid seen on series 2, image 64 measuring 4.6 x 2.0 cm, consistent with perforated diverticulitis. There is mild associated bowel wall thickening in this region. The remainder of the colon and appendix are normal in appearance. Vascular/Lymphatic: There is a fat containing umbilical hernia. The abdominal aorta is normal. No adenopathy. Reproductive: No mass or other significant abnormality. Other: None. Musculoskeletal: Degenerative changes are seen in the SI joints. No other bony abnormalities. IMPRESSION: 1. Perforated sigmoid diverticulitis with a 4.6 x 2.0 cm collection of extraluminal gas and air. No free air. 2. 4 mm low-attenuation mass in pancreatic head, likely a small cyst. Recommend an MRI in 1 year to ensure stability. 3. No other acute abnormalities. The findings were called to the patient's physician assistant, Deliah BostonMichael Clark. Electronically Signed   By: Gerome Samavid  Williams III M.D   On: 07/22/2015 18:26   Dg Abd 2 Views  07/22/2015  CLINICAL DATA:  Left lower abdomen pain radiating to the groin EXAM: ABDOMEN - 2 VIEW COMPARISON:  None. FINDINGS: No opaque renal or ureteral calculi are noted by plain film. The bowel gas pattern is nonspecific. No bony abnormality is seen. IMPRESSION: No opaque calculi are noted.  The bowel gas pattern is nonspecific. Electronically Signed   By: Dwyane DeePaul  Barry M.D.   On: 07/22/2015 15:11   Ct Image Guided Drainage By  Percutaneous Catheter  07/24/2015  INDICATION: Left lower quadrant abscess EXAM: CT GUIDED DRAINAGE OF LEFT LOWER QUADRANT DIVERTICULAR ABSCESS MEDICATIONS: The patient is currently admitted to the hospital and receiving intravenous antibiotics. The antibiotics were administered within an appropriate time frame prior to the initiation  of the procedure. ANESTHESIA/SEDATION: Three mg IV Versed 50 mcg IV Fentanyl Moderate Sedation Time:  15 The patient was continuously monitored during the procedure by the interventional radiology nurse under my direct supervision. COMPLICATIONS: None immediate. TECHNIQUE: Informed written consent was obtained from the patient after a thorough discussion of the procedural risks, benefits and alternatives. All questions were addressed. Maximal Sterile Barrier Technique was utilized including caps, mask, sterile gowns, sterile gloves, sterile drape, hand hygiene and skin antiseptic. A timeout was performed prior to the initiation of the procedure. PROCEDURE: The left lower quadrant was prepped with ChloraPrep in a sterile fashion, and a sterile drape was applied covering the operative field. A sterile gown and sterile gloves were used for the procedure. Local anesthesia was provided with 1% Lidocaine. Under CT guidance, an 18 gauge needle was inserted into the left lower quadrant abscess. It was removed over an Amplatz wire. A 10 French dilator followed by a 10 [place] drain was then inserted into the fluid collection. Was looped and string fixed, then sewn to the skin. Frank pus was aspirated. FINDINGS: Images document 58 French drain placement into a left lower quadrant diverticular abscess. IMPRESSION: Successful left lower quadrant diverticular abscess 10 French drain. Electronically Signed   By: Jolaine Click M.D.   On: 07/24/2015 17:29     LOS: 3 days   Jeoffrey Massed, MD  [facility] Pager:[phone]  If 7PM-7AM, please contact  night-coverage [email or web address] Password [email or web address] 07/25/2015, 1:01 PM

## 2015-07-25 NOTE — Progress Notes (Signed)
Subjective: Patient's abdomen is less tender than before. He is still tender over the drain site. He had an order for our regular diet. He vomited that last evening. He was able to take some liquids and peaches last evening, he had some eggs this a.m. He seems to be tolerating that.  Objective: Vital signs in last 24 hours: Temp:  [97.7 F (36.5 C)-98.8 F (37.1 C)] 98.5 F (36.9 C) (07/21 0449) Pulse Rate:  [64-75] 64 (07/21 0449) Resp:  [12-20] 20 (07/21 0449) BP: (104-122)/(64-77) 106/69 mmHg (07/21 0449) SpO2:  [94 %-100 %] 94 % (07/21 0449) Last BM Date: 07/23/15  Intake/Output from previous day: 07/20 0701 - 07/21 0700 In: 0  Out: 10 [Drains:10] Intake/Output this shift:    General appearance: alert, cooperative and no distress GI: Soft tissues more tender in the left lower quadrant drain is. Drainage is currently clear looks more like irrigation but there is nothing in the JP bulb. Bowel sounds are still very hypoactive.  Lab Results:   Recent Labs  07/23/15 0129 07/24/15 0522  WBC 15.0* 11.7*  HGB 14.4 13.3  HCT 41.9 38.7*  PLT 213 215    BMET  Recent Labs  07/22/15 1936 07/23/15 0129  NA 135 136  K 3.8 3.7  CL 101 106  CO2 23 25  GLUCOSE 98 96  BUN 10 9  CREATININE 0.95 0.90  CALCIUM 9.2 8.1*   PT/INR  Recent Labs  07/22/15 2217  LABPROT 15.9*  INR 1.26     Recent Labs Lab 07/22/15 1451  AST 17  ALT 23  ALKPHOS 46  BILITOT 1.3*  PROT 7.0  ALBUMIN 4.4     Lipase     Component Value Date/Time   LIPASE 20 07/22/2015 1451     Studies/Results: Ct Image Guided Drainage By Percutaneous Catheter  07/24/2015  INDICATION: Left lower quadrant abscess EXAM: CT GUIDED DRAINAGE OF LEFT LOWER QUADRANT DIVERTICULAR ABSCESS MEDICATIONS: The patient is currently admitted to the hospital and receiving intravenous antibiotics. The antibiotics were administered within an appropriate time frame prior to the initiation of the procedure.  ANESTHESIA/SEDATION: Three mg IV Versed 50 mcg IV Fentanyl Moderate Sedation Time:  15 The patient was continuously monitored during the procedure by the interventional radiology nurse under my direct supervision. COMPLICATIONS: None immediate. TECHNIQUE: Informed written consent was obtained from the patient after a thorough discussion of the procedural risks, benefits and alternatives. All questions were addressed. Maximal Sterile Barrier Technique was utilized including caps, mask, sterile gowns, sterile gloves, sterile drape, hand hygiene and skin antiseptic. A timeout was performed prior to the initiation of the procedure. PROCEDURE: The left lower quadrant was prepped with ChloraPrep in a sterile fashion, and a sterile drape was applied covering the operative field. A sterile gown and sterile gloves were used for the procedure. Local anesthesia was provided with 1% Lidocaine. Under CT guidance, an 18 gauge needle was inserted into the left lower quadrant abscess. It was removed over an Amplatz wire. A 10 French dilator followed by a 10 [place] drain was then inserted into the fluid collection. Was looped and string fixed, then sewn to the skin. Frank pus was aspirated. FINDINGS: Images document 83 French drain placement into a left lower quadrant diverticular abscess. IMPRESSION: Successful left lower quadrant diverticular abscess 10 French drain. Electronically Signed   By: Jolaine Click M.D.   On: 07/24/2015 17:29    Medications: . cefTRIAXone (ROCEPHIN)  IV  2 g Intravenous Daily  . metronidazole  500 mg Intravenous Q8H  . sodium chloride  1,500 mL Intravenous Once     Assessment/Plan Diverticulitis with perforation and 4.6 x 2 cm Abscess IR drain placement 07/24/15 FEN:  IV fluids/regular diet is ordered -go back to a clear liquid diet ID: Day 3 Rocephin/Flagyl  - cultures pending DVT: SCDs only; will add Lovenox   Plan: Clear liquid diet for now, continue to mobilize continue antibiotics and  drain. Recheck labs in a.m.    LOS: 3 days    Camren Henthorn 07/25/2015 [phone]

## 2015-07-25 NOTE — Progress Notes (Signed)
Patient ID: Marcus [name], male   DOB: Mar 29, 1955, 60 y.o.   MRN: [id number]    Referring Physician(s): Dr. Jaclynn Guarneri  Supervising Physician: Malachy Moan  Patient Status: inpt  Chief Complaint: Diverticular abscess  Subjective: Patient still with some pain, no better after drain placement.  Otherwise no complaints.  Allergies: Review of patient's allergies indicates no known allergies.  Medications: Prior to Admission medications   Medication Sig Start Date End Date Taking? Authorizing Provider  acetaminophen (TYLENOL) 500 MG tablet Take 500 mg by mouth every 6 (six) hours as needed for moderate pain.   Yes Historical Provider, MD  ibuprofen (ADVIL,MOTRIN) 200 MG tablet Take 400 mg by mouth every 6 (six) hours as needed for moderate pain.   Yes Historical Provider, MD    Vital Signs: BP 106/69 mmHg  Pulse 64  Temp(Src) 98.5 F (36.9 C) (Oral)  Resp 20  Ht  (1.727 m)  Wt 169 lb (76.658 kg)  BMI 25.70 kg/m2  SpO2 94%  Physical Exam: Abd: soft, currently NT (just took percocet), drain with minimal serous drainage, 10cc/24 hrs.  Drain site is c/d/i  Imaging: Ct Abdomen Pelvis W Contrast  07/22/2015  CLINICAL DATA:  Left lower quadrant pain. EXAM: CT ABDOMEN AND PELVIS WITH CONTRAST TECHNIQUE: Multidetector CT imaging of the abdomen and pelvis was performed using the standard protocol following bolus administration of intravenous contrast. CONTRAST:  ISOVUE-300 IOPAMIDOL (ISOVUE-300) INJECTION 61% COMPARISON:  KUB from earlier today FINDINGS: Lower chest:  No acute findings. Hepatobiliary: There is 15 mm low-attenuation mass in the right hepatic lobe on series 2, image 22 which is too small to characterize but most likely either a cyst or hemangioma. Hepatic steatosis is identified. The liver, portal vein, and gallbladder are otherwise normal. Pancreas: There is a tiny low-attenuation lesion measuring 4 mm in the pancreatic head on series 2, image 25. The  attenuation in this lesion is 3 Hounsfield units most consistent with a small cyst. The remainder of the pancreas is normal in appearance. Spleen: Within normal limits in size and appearance. Adrenals/Urinary Tract: No masses identified. No evidence of hydronephrosis. Stomach/Bowel: The stomach small bowel are normal. Diverticuli are seen throughout colon, particularly in the sigmoid colon. There is fat stranding adjacent to the sigmoid colon with extraluminal gas and fluid seen on series 2, image 64 measuring 4.6 x 2.0 cm, consistent with perforated diverticulitis. There is mild associated bowel wall thickening in this region. The remainder of the colon and appendix are normal in appearance. Vascular/Lymphatic: There is a fat containing umbilical hernia. The abdominal aorta is normal. No adenopathy. Reproductive: No mass or other significant abnormality. Other: None. Musculoskeletal: Degenerative changes are seen in the SI joints. No other bony abnormalities. IMPRESSION: 1. Perforated sigmoid diverticulitis with a 4.6 x 2.0 cm collection of extraluminal gas and air. No free air. 2. 4 mm low-attenuation mass in pancreatic head, likely a small cyst. Recommend an MRI in 1 year to ensure stability. 3. No other acute abnormalities. The findings were called to the patient's physician assistant, Deliah Boston. Electronically Signed   By: Gerome Sam III M.D   On: 07/22/2015 18:26   Dg Abd 2 Views  07/22/2015  CLINICAL DATA:  Left lower abdomen pain radiating to the groin EXAM: ABDOMEN - 2 VIEW COMPARISON:  None. FINDINGS: No opaque renal or ureteral calculi are noted by plain film. The bowel gas pattern is nonspecific. No bony abnormality is seen. IMPRESSION: No opaque calculi are noted.  The  bowel gas pattern is nonspecific. Electronically Signed   By: Dwyane DeePaul  Barry M.D.   On: 07/22/2015 15:11   Ct Image Guided Drainage By Percutaneous Catheter  07/24/2015  INDICATION: Left lower quadrant abscess EXAM: CT GUIDED  DRAINAGE OF LEFT LOWER QUADRANT DIVERTICULAR ABSCESS MEDICATIONS: The patient is currently admitted to the hospital and receiving intravenous antibiotics. The antibiotics were administered within an appropriate time frame prior to the initiation of the procedure. ANESTHESIA/SEDATION: Three mg IV Versed 50 mcg IV Fentanyl Moderate Sedation Time:  15 The patient was continuously monitored during the procedure by the interventional radiology nurse under my direct supervision. COMPLICATIONS: None immediate. TECHNIQUE: Informed written consent was obtained from the patient after a thorough discussion of the procedural risks, benefits and alternatives. All questions were addressed. Maximal Sterile Barrier Technique was utilized including caps, mask, sterile gowns, sterile gloves, sterile drape, hand hygiene and skin antiseptic. A timeout was performed prior to the initiation of the procedure. PROCEDURE: The left lower quadrant was prepped with ChloraPrep in a sterile fashion, and a sterile drape was applied covering the operative field. A sterile gown and sterile gloves were used for the procedure. Local anesthesia was provided with 1% Lidocaine. Under CT guidance, an 18 gauge needle was inserted into the left lower quadrant abscess. It was removed over an Amplatz wire. A 10 French dilator followed by a 10 [place] drain was then inserted into the fluid collection. Was looped and string fixed, then sewn to the skin. Frank pus was aspirated. FINDINGS: Images document 6210 French drain placement into a left lower quadrant diverticular abscess. IMPRESSION: Successful left lower quadrant diverticular abscess 10 French drain. Electronically Signed   By: Jolaine ClickArthur  Hoss M.D.   On: 07/24/2015 17:29    Labs:  CBC:  Recent Labs  07/22/15 1514 07/23/15 0129 07/24/15 0522  WBC 14.8* 15.0* 11.7*  HGB 16.4 14.4 13.3  HCT 46.4 41.9 38.7*  PLT  --  213 215    COAGS:  Recent Labs  07/22/15 2217  INR 1.26  APTT 29     BMP:  Recent Labs  07/22/15 1451 07/22/15 1936 07/23/15 0129  NA 140 135 136  K 5.0 3.8 3.7  CL 101 101 106  CO2 26 23 25   GLUCOSE 89 98 96  BUN 11 10 9   CALCIUM 9.5 9.2 8.1*  CREATININE 1.07 0.95 0.90  GFRNONAA 76 >60 >60  GFRAA 87 >60 >60    LIVER FUNCTION TESTS:  Recent Labs  07/22/15 1451  BILITOT 1.3*  AST 17  ALT 23  ALKPHOS 46  PROT 7.0  ALBUMIN 4.4    Assessment and Plan: 1. Diverticular abscess, s/p perc drain on 7/20, [place] -cont drain for now -cont flushes, follow output -will need repeat CT in about 5-7 days if output remains under 15cc/day -will follow  Electronically Signed: Yedidya Duddy E 07/25/2015, 2:19 PM   I spent a total of 15 Minutes at the the patient's bedside AND on the patient's hospital floor or unit, greater than 50% of which was counseling/coordinating care for diverticular abscess

## 2015-07-26 LAB — BASIC METABOLIC PANEL
Anion gap: 7 (ref 5–15)
BUN: 8 mg/dL (ref 6–20)
CHLORIDE: 103 mmol/L (ref 101–111)
CO2: 26 mmol/L (ref 22–32)
CREATININE: 0.71 mg/dL (ref 0.61–1.24)
Calcium: 8.5 mg/dL — ABNORMAL LOW (ref 8.9–10.3)
GFR calc Af Amer: >60 mL/min (ref >60)
Glucose, Bld: 86 mg/dL (ref 65–99)
Potassium: 3.4 mmol/L — ABNORMAL LOW (ref 3.5–5.1)
SODIUM: 136 mmol/L (ref 135–145)

## 2015-07-26 LAB — CBC
HCT: 42.6 % (ref 39.0–52.0)
Hemoglobin: 14.6 g/dL (ref 13.0–17.0)
MCH: 30 pg (ref 26.0–34.0)
MCHC: 34.3 g/dL (ref 30.0–36.0)
MCV: 87.5 fL (ref 78.0–100.0)
PLATELETS: 288 10*3/uL (ref 150–400)
RBC: 4.87 MIL/uL (ref 4.22–5.81)
RDW: 13.1 % (ref 11.5–15.5)
WBC: 7.3 10*3/uL (ref 4.0–10.5)

## 2015-07-26 LAB — GLUCOSE, CAPILLARY: GLUCOSE-CAPILLARY: 82 mg/dL (ref 65–99)

## 2015-07-26 MED ORDER — POTASSIUM CHLORIDE 20 MEQ/15ML (10%) PO SOLN
40.0000 meq | Freq: Four times a day (QID) | ORAL | Status: AC
  Administered 2015-07-26 (×2): 40 meq via ORAL
  Filled 2015-07-26 (×2): qty 30

## 2015-07-26 NOTE — Progress Notes (Signed)
Subjective: Feels better. Tolerating clears  Objective: Vital signs in last 24 hours: Temp:  [97.7 F (36.5 C)-98.6 F (37 C)] 97.7 F (36.5 C) (07/22 0500) Pulse Rate:  [62-65] 62 (07/22 0500) Resp:  [18-19] 18 (07/22 0500) BP: (117-136)/(73-81) 117/80 mmHg (07/22 0500) SpO2:  [95 %-96 %] 95 % (07/22 0500) Last BM Date: 07/23/15  Intake/Output from previous day: 07/21 0701 - 07/22 0700 In: 115 [P.O.:100] Out: 5 [Drains:5] Intake/Output this shift:    Resp: clear to auscultation bilaterally Cardio: regular rate and rhythm GI: soft, minimal tenderness. drain output serous  Lab Results:   Recent Labs  07/24/15 0522 07/26/15 0546  WBC 11.7* 7.3  HGB 13.3 14.6  HCT 38.7* 42.6  PLT 215 288   BMET  Recent Labs  07/26/15 0546  NA 136  K 3.4*  CL 103  CO2 26  GLUCOSE 86  BUN 8  CREATININE 0.71  CALCIUM 8.5*   PT/INR No results for input(s): LABPROT, INR in the last 72 hours. ABG No results for input(s): PHART, HCO3 in the last 72 hours.  Invalid input(s): PCO2, PO2  Studies/Results: Ct Image Guided Drainage By Percutaneous Catheter  07/24/2015  INDICATION: Left lower quadrant abscess EXAM: CT GUIDED DRAINAGE OF LEFT LOWER QUADRANT DIVERTICULAR ABSCESS MEDICATIONS: The patient is currently admitted to the hospital and receiving intravenous antibiotics. The antibiotics were administered within an appropriate time frame prior to the initiation of the procedure. ANESTHESIA/SEDATION: Three mg IV Versed 50 mcg IV Fentanyl Moderate Sedation Time:  15 The patient was continuously monitored during the procedure by the interventional radiology nurse under my direct supervision. COMPLICATIONS: None immediate. TECHNIQUE: Informed written consent was obtained from the patient after a thorough discussion of the procedural risks, benefits and alternatives. All questions were addressed. Maximal Sterile Barrier Technique was utilized including caps, mask, sterile gowns, sterile  gloves, sterile drape, hand hygiene and skin antiseptic. A timeout was performed prior to the initiation of the procedure. PROCEDURE: The left lower quadrant was prepped with ChloraPrep in a sterile fashion, and a sterile drape was applied covering the operative field. A sterile gown and sterile gloves were used for the procedure. Local anesthesia was provided with 1% Lidocaine. Under CT guidance, an 18 gauge needle was inserted into the left lower quadrant abscess. It was removed over an Amplatz wire. A 10 French dilator followed by a 10 [place] drain was then inserted into the fluid collection. Was looped and string fixed, then sewn to the skin. Frank pus was aspirated. FINDINGS: Images document 44 French drain placement into a left lower quadrant diverticular abscess. IMPRESSION: Successful left lower quadrant diverticular abscess 10 French drain. Electronically Signed   By: Jolaine Click M.D.   On: 07/24/2015 17:29    Anti-infectives: Anti-infectives    Start     Dose/Rate Route Frequency Ordered Stop   07/23/15 1000  ciprofloxacin (CIPRO) IVPB 400 mg  Status:  Discontinued     400 mg 200 mL/hr over 60 Minutes Intravenous 2 times daily 07/22/15 2130 07/23/15 0835   07/23/15 0900  cefTRIAXone (ROCEPHIN) 2 g in dextrose 5 % 50 mL IVPB     2 g 100 mL/hr over 30 Minutes Intravenous Daily 07/23/15 0835     07/23/15 0400  metroNIDAZOLE (FLAGYL) IVPB 500 mg     500 mg 100 mL/hr over 60 Minutes Intravenous Every 8 hours 07/22/15 2130     07/22/15 2200  ciprofloxacin (CIPRO) IVPB 400 mg  Status:  Discontinued  400 mg 200 mL/hr over 60 Minutes Intravenous  Once 07/22/15 2100 07/23/15 0838   07/22/15 2115  metroNIDAZOLE (FLAGYL) IVPB 500 mg  Status:  Discontinued     500 mg 100 mL/hr over 60 Minutes Intravenous Every 8 hours 07/22/15 2100 07/22/15 2130   07/22/15 1930  ciprofloxacin (CIPRO) IVPB 400 mg     400 mg 200 mL/hr over 60 Minutes Intravenous  Once 07/22/15 1929 07/22/15 2054   07/22/15  1930  metroNIDAZOLE (FLAGYL) IVPB 500 mg     500 mg 100 mL/hr over 60 Minutes Intravenous  Once 07/22/15 1929 07/22/15 2154      Assessment/Plan: s/p * No surgery found * Advance to full liquids today  Continue rocephin/flagyl day 4 for diverticulitis Ambulate Continue drain for now  LOS: 4 days    TOTH III,Levent Kornegay S 07/26/2015

## 2015-07-26 NOTE — Progress Notes (Signed)
PROGRESS NOTE        PATIENT DETAILS Name: Marcus [name] Age: 60 y.o. Sex: male Date of Birth: [birth year] Admit Date: 07/22/2015 Admitting Physician Lorretta Harp, MD JYN:[id number] Nedra Hai, PA-C  Brief Narrative:  Patient is a 60 y.o. male with no past medical history admitted with abdominal pain, found to have diverticulitis with perforation and a small abscess. There went CT guided Drain placement on 7/20, improving slowly.  Subjective:  Much improved pain in the left lower quadrant area. No N&V, no chest pain or SOB.  Assessment/Plan:  Perforated diverticulitis of the sigmoid colon with a small abscess: Status post CT-guided drainage by interventional radiology on 7/20.Slowly improving-diet being advanced by general surgery, Continue empiric antibiotics.   Incidental finding of 4 mm low-attenuation mass in pancreatic head on CT scan abdomen: Per radiology this is likely a small cyst. Recommending an MRI in 1 year to ensure stability.   DVT Prophylaxis: Prophylactic Lovenox   Code Status: Full code   Family Communication: None at bedside  Disposition Plan: Remain inpatient-home likely over the weekend  Antimicrobial agents: Rocephin 7/19>> Flagyl 7/18>>  Procedures: None  CONSULTS:  general surgery  Time spent: 25 minutes-Greater than 50% of this time was spent in counseling, explanation of diagnosis, planning of further management, and coordination of care.  MEDICATIONS: Anti-infectives    Start     Dose/Rate Route Frequency Ordered Stop   07/23/15 1000  ciprofloxacin (CIPRO) IVPB 400 mg  Status:  Discontinued     400 mg 200 mL/hr over 60 Minutes Intravenous 2 times daily 07/22/15 2130 07/23/15 0835   07/23/15 0900  cefTRIAXone (ROCEPHIN) 2 g in dextrose 5 % 50 mL IVPB     2 g 100 mL/hr over 30 Minutes Intravenous Daily 07/23/15 0835     07/23/15 0400  metroNIDAZOLE (FLAGYL) IVPB 500 mg     500 mg 100 mL/hr over 60 Minutes  Intravenous Every 8 hours 07/22/15 2130     07/22/15 2200  ciprofloxacin (CIPRO) IVPB 400 mg  Status:  Discontinued     400 mg 200 mL/hr over 60 Minutes Intravenous  Once 07/22/15 2100 07/23/15 0838   07/22/15 2115  metroNIDAZOLE (FLAGYL) IVPB 500 mg  Status:  Discontinued     500 mg 100 mL/hr over 60 Minutes Intravenous Every 8 hours 07/22/15 2100 07/22/15 2130   07/22/15 1930  ciprofloxacin (CIPRO) IVPB 400 mg     400 mg 200 mL/hr over 60 Minutes Intravenous  Once 07/22/15 1929 07/22/15 2054   07/22/15 1930  metroNIDAZOLE (FLAGYL) IVPB 500 mg     500 mg 100 mL/hr over 60 Minutes Intravenous  Once 07/22/15 1929 07/22/15 2154      Scheduled Meds: . cefTRIAXone (ROCEPHIN)  IV  2 g Intravenous Daily  . enoxaparin (LOVENOX) injection  40 mg Subcutaneous Daily  . metronidazole  500 mg Intravenous Q8H  . potassium chloride  40 mEq Oral Q6H  . sodium chloride  1,500 mL Intravenous Once   Continuous Infusions:   PRN Meds:.acetaminophen **OR** acetaminophen, morphine injection, ondansetron, oxyCODONE-acetaminophen   PHYSICAL EXAM: Vital signs: Filed Vitals:   07/25/15 0449 07/25/15 1435 07/25/15 2133 07/26/15 0500  BP: 106/69 124/73 136/81 117/80  Pulse: 64 65 65 62  Temp: 98.5 F (36.9 C) 98.3 F (36.8 C) 98.6 F (37 C) 97.7 F (36.5 C)  TempSrc: Oral Oral Oral  Oral  Resp: 20 18 19 18   Height:      Weight:      SpO2: 94% 96% 95% 95%   Filed Weights   07/22/15 1934  Weight: 76.658 kg (169 lb)   Body mass index is 25.7 kg/(m^2).   Gen Exam: Awake and alert with clear speech. Not in any distress  Neck: Supple, No JVD.   Chest: B/L Clear.   CVS: S1 S2 Regular, no murmurs.  Abdomen: soft, BS +, Moderately Tender left lower quadrant without any peritoneal signs. Has a left lower quadrant drain in place Extremities: no edema, lower extremities warm to touch. Neurologic: Non Focal.   Skin: No Rash or lesions   Wounds: N/A.    LABORATORY DATA: CBC:  Recent Labs Lab  07/22/15 1514 07/23/15 0129 07/24/15 0522 07/26/15 0546  WBC 14.8* 15.0* 11.7* 7.3  HGB 16.4 14.4 13.3 14.6  HCT 46.4 41.9 38.7* 42.6  MCV 88.8 88.0 89.0 87.5  PLT  --  213 215 288    Basic Metabolic Panel:  Recent Labs Lab 07/22/15 1451 07/22/15 1936 07/23/15 0129 07/26/15 0546  NA 140 135 136 136  K 5.0 3.8 3.7 3.4*  CL 101 101 106 103  CO2 26 23 25 26   GLUCOSE 89 98 96 86  BUN 11 10 9 8   CREATININE 1.07 0.95 0.90 0.71  CALCIUM 9.5 9.2 8.1* 8.5*    GFR: Estimated Creatinine Clearance: 96.2 mL/min (by C-G formula based on Cr of 0.71).  Liver Function Tests:  Recent Labs Lab 07/22/15 1451  AST 17  ALT 23  ALKPHOS 46  BILITOT 1.3*  PROT 7.0  ALBUMIN 4.4    Recent Labs Lab 07/22/15 1451  LIPASE 20   No results for input(s): AMMONIA in the last 168 hours.  Coagulation Profile:  Recent Labs Lab 07/22/15 2217  INR 1.26    Cardiac Enzymes: No results for input(s): CKTOTAL, CKMB, CKMBINDEX, TROPONINI in the last 168 hours.  BNP (last 3 results) No results for input(s): PROBNP in the last 8760 hours.  HbA1C: No results for input(s): HGBA1C in the last 72 hours.  CBG:  Recent Labs Lab 07/23/15 0726 07/24/15 0743 07/25/15 0736 07/26/15 0743  GLUCAP 85 79 85 82    Lipid Profile: No results for input(s): CHOL, HDL, LDLCALC, TRIG, CHOLHDL, LDLDIRECT in the last 72 hours.  Thyroid Function Tests: No results for input(s): TSH, T4TOTAL, FREET4, T3FREE, THYROIDAB in the last 72 hours.  Anemia Panel: No results for input(s): VITAMINB12, FOLATE, FERRITIN, TIBC, IRON, RETICCTPCT in the last 72 hours.  Urine analysis:    Component Value Date/Time   BILIRUBINUR negative 07/22/2015 1458   KETONESUR negative 07/22/2015 1458   PROTEINUR negative 07/22/2015 1458   UROBILINOGEN 0.2 07/22/2015 1458   NITRITE Negative 07/22/2015 1458   LEUKOCYTESUR Negative 07/22/2015 1458    Sepsis Labs: Lactic Acid, Venous    Component Value Date/Time    LATICACIDVEN 0.8 07/23/2015 0129    MICROBIOLOGY: Recent Results (from the past 240 hour(s))  Culture, blood (x 2)     Status: None (Preliminary result)   Collection Time: 07/22/15 10:17 PM  Result Value Ref Range Status   Specimen Description RIGHT ANTECUBITAL  Final   Special Requests BOTTLES DRAWN AEROBIC AND ANAEROBIC 5CC  Final   Culture   Final    NO GROWTH 3 DAYS Performed at [facility]    Report Status PENDING  Incomplete  Culture, blood (x 2)     Status:  None (Preliminary result)   Collection Time: 07/22/15 10:17 PM  Result Value Ref Range Status   Specimen Description BLOOD LEFT HAND  Final   Special Requests IN PEDIATRIC BOTTLE 1.5CC  Final   Culture   Final    NO GROWTH 3 DAYS Performed at [facility]    Report Status PENDING  Incomplete  Aerobic/Anaerobic Culture (surgical/deep wound)     Status: None (Preliminary result)   Collection Time: 07/24/15  5:35 PM  Result Value Ref Range Status   Specimen Description ABSCESS ABDOMEN LEFT LOWER QUAD  Final   Special Requests NONE  Final   Gram Stain   Final    ABUNDANT WBC PRESENT, PREDOMINANTLY PMN NO ORGANISMS SEEN    Culture   Final    CULTURE REINCUBATED FOR BETTER GROWTH Performed at [facility]    Report Status PENDING  Incomplete    RADIOLOGY STUDIES/RESULTS: Ct Abdomen Pelvis W Contrast  07/22/2015  CLINICAL DATA:  Left lower quadrant pain. EXAM: CT ABDOMEN AND PELVIS WITH CONTRAST TECHNIQUE: Multidetector CT imaging of the abdomen and pelvis was performed using the standard protocol following bolus administration of intravenous contrast. CONTRAST:  ISOVUE-300 IOPAMIDOL (ISOVUE-300) INJECTION 61% COMPARISON:  KUB from earlier today FINDINGS: Lower chest:  No acute findings. Hepatobiliary: There is 15 mm low-attenuation mass in the right hepatic lobe on series 2, image 22 which is too small to characterize but most likely either a cyst or hemangioma. Hepatic steatosis is  identified. The liver, portal vein, and gallbladder are otherwise normal. Pancreas: There is a tiny low-attenuation lesion measuring 4 mm in the pancreatic head on series 2, image 25. The attenuation in this lesion is 3 Hounsfield units most consistent with a small cyst. The remainder of the pancreas is normal in appearance. Spleen: Within normal limits in size and appearance. Adrenals/Urinary Tract: No masses identified. No evidence of hydronephrosis. Stomach/Bowel: The stomach small bowel are normal. Diverticuli are seen throughout colon, particularly in the sigmoid colon. There is fat stranding adjacent to the sigmoid colon with extraluminal gas and fluid seen on series 2, image 64 measuring 4.6 x 2.0 cm, consistent with perforated diverticulitis. There is mild associated bowel wall thickening in this region. The remainder of the colon and appendix are normal in appearance. Vascular/Lymphatic: There is a fat containing umbilical hernia. The abdominal aorta is normal. No adenopathy. Reproductive: No mass or other significant abnormality. Other: None. Musculoskeletal: Degenerative changes are seen in the SI joints. No other bony abnormalities. IMPRESSION: 1. Perforated sigmoid diverticulitis with a 4.6 x 2.0 cm collection of extraluminal gas and air. No free air. 2. 4 mm low-attenuation mass in pancreatic head, likely a small cyst. Recommend an MRI in 1 year to ensure stability. 3. No other acute abnormalities. The findings were called to the patient's physician assistant, Deliah Boston. Electronically Signed   By: Gerome Sam III M.D   On: 07/22/2015 18:26   Dg Abd 2 Views  07/22/2015  CLINICAL DATA:  Left lower abdomen pain radiating to the groin EXAM: ABDOMEN - 2 VIEW COMPARISON:  None. FINDINGS: No opaque renal or ureteral calculi are noted by plain film. The bowel gas pattern is nonspecific. No bony abnormality is seen. IMPRESSION: No opaque calculi are noted.  The bowel gas pattern is nonspecific.  Electronically Signed   By: Dwyane Dee M.D.   On: 07/22/2015 15:11   Ct Image Guided Drainage By Percutaneous Catheter  07/24/2015  INDICATION: Left lower quadrant abscess EXAM: CT  GUIDED DRAINAGE OF LEFT LOWER QUADRANT DIVERTICULAR ABSCESS MEDICATIONS: The patient is currently admitted to the hospital and receiving intravenous antibiotics. The antibiotics were administered within an appropriate time frame prior to the initiation of the procedure. ANESTHESIA/SEDATION: Three mg IV Versed 50 mcg IV Fentanyl Moderate Sedation Time:  15 The patient was continuously monitored during the procedure by the interventional radiology nurse under my direct supervision. COMPLICATIONS: None immediate. TECHNIQUE: Informed written consent was obtained from the patient after a thorough discussion of the procedural risks, benefits and alternatives. All questions were addressed. Maximal Sterile Barrier Technique was utilized including caps, mask, sterile gowns, sterile gloves, sterile drape, hand hygiene and skin antiseptic. A timeout was performed prior to the initiation of the procedure. PROCEDURE: The left lower quadrant was prepped with ChloraPrep in a sterile fashion, and a sterile drape was applied covering the operative field. A sterile gown and sterile gloves were used for the procedure. Local anesthesia was provided with 1% Lidocaine. Under CT guidance, an 18 gauge needle was inserted into the left lower quadrant abscess. It was removed over an Amplatz wire. A 10 French dilator followed by a 10 [place] drain was then inserted into the fluid collection. Was looped and string fixed, then sewn to the skin. Frank pus was aspirated. FINDINGS: Images document 68 French drain placement into a left lower quadrant diverticular abscess. IMPRESSION: Successful left lower quadrant diverticular abscess 10 French drain. Electronically Signed   By: Jolaine Click M.D.   On: 07/24/2015 17:29     LOS: 4 days   Leroy Sea,  MD  [facility] Pager:[phone]  If 7PM-7AM, please contact night-coverage [email or web address] Password [facility] 07/26/2015, 12:18 PM

## 2015-07-27 LAB — CULTURE, BLOOD (ROUTINE X 2)
CULTURE: NO GROWTH
Culture: NO GROWTH

## 2015-07-27 LAB — GLUCOSE, CAPILLARY: Glucose-Capillary: 81 mg/dL (ref 65–99)

## 2015-07-27 LAB — POTASSIUM: Potassium: 4.4 mmol/L (ref 3.5–5.1)

## 2015-07-27 MED ORDER — AMOXICILLIN-POT CLAVULANATE 875-125 MG PO TABS
1.0000 | ORAL_TABLET | Freq: Two times a day (BID) | ORAL | Status: DC
  Administered 2015-07-27 – 2015-07-28 (×3): 1 via ORAL
  Filled 2015-07-27 (×3): qty 1

## 2015-07-27 NOTE — Progress Notes (Signed)
  Subjective: No complaints  Objective: Vital signs in last 24 hours: Temp:  [98.5 F (36.9 C)-99 F (37.2 C)] 98.5 F (36.9 C) (07/23 0500) Pulse Rate:  [61-64] 61 (07/23 0500) Resp:  [16-19] 19 (07/23 0500) BP: (115-120)/(68-81) 115/70 (07/23 0500) SpO2:  [93 %-94 %] 93 % (07/23 0500) Last BM Date: 07/23/15  Intake/Output from previous day: 07/22 0701 - 07/23 0700 In: 15  Out: 20 [Drains:20] Intake/Output this shift: No intake/output data recorded.  Resp: clear to auscultation bilaterally Cardio: regular rate and rhythm GI: soft, nontender. drain output serous  Lab Results:   Recent Labs  07/26/15 0546  WBC 7.3  HGB 14.6  HCT 42.6  PLT 288   BMET  Recent Labs  07/26/15 0546 07/27/15 0542  NA 136  --   K 3.4* 4.4  CL 103  --   CO2 26  --   GLUCOSE 86  --   BUN 8  --   CREATININE 0.71  --   CALCIUM 8.5*  --    PT/INR No results for input([name]): LABPROT, INR in the last 72 hours. ABG No results for input([name]): PHART, HCO3 in the last 72 hours.  Invalid input([name]): PCO2, PO2  Studies/Results: No results found.  Anti-infectives: Anti-infectives    Start     Dose/Rate Route Frequency Ordered Stop   07/27/15 1100  amoxicillin-clavulanate (AUGMENTIN) 875-125 MG per tablet 1 tablet     1 tablet Oral Every 12 hours 07/27/15 0644     07/23/15 1000  ciprofloxacin (CIPRO) IVPB 400 mg  Status:  Discontinued     400 mg 200 mL/hr over 60 Minutes Intravenous 2 times daily 07/22/15 2130 07/23/15 0835   07/23/15 0900  cefTRIAXone (ROCEPHIN) 2 g in dextrose 5 % 50 mL IVPB     2 g 100 mL/hr over 30 Minutes Intravenous Daily 07/23/15 0835 07/27/15 1100   07/23/15 0400  metroNIDAZOLE (FLAGYL) IVPB 500 mg     500 mg 100 mL/hr over 60 Minutes Intravenous Every 8 hours 07/22/15 2130 07/27/15 0500   07/22/15 2200  ciprofloxacin (CIPRO) IVPB 400 mg  Status:  Discontinued     400 mg 200 mL/hr over 60 Minutes Intravenous  Once 07/22/15 2100 07/23/15 0838   07/22/15 2115   metroNIDAZOLE (FLAGYL) IVPB 500 mg  Status:  Discontinued     500 mg 100 mL/hr over 60 Minutes Intravenous Every 8 hours 07/22/15 2100 07/22/15 2130   07/22/15 1930  ciprofloxacin (CIPRO) IVPB 400 mg     400 mg 200 mL/hr over 60 Minutes Intravenous  Once 07/22/15 1929 07/22/15 2054   07/22/15 1930  metroNIDAZOLE (FLAGYL) IVPB 500 mg     500 mg 100 mL/hr over 60 Minutes Intravenous  Once 07/22/15 1929 07/22/15 2154      Assessment/Plan: [name]/p * No surgery found * Advance diet. Start soft food today Switch to oral abx Consider removing drain if no output  LOS: 5 days    Marcus [name],Marcus [name] 07/27/2015

## 2015-07-27 NOTE — Progress Notes (Signed)
Patient ID: Marcus [name], male   DOB: August 31, 1955, 60 y.o.   MRN: [id number]    Referring Physician(s): Dr. Jaclynn Guarneri  Supervising Physician: Malachy Moan  Patient Status: inpt  Chief Complaint: Diverticular abscess  Subjective: Patient feeling much better overall. Chart reviewed, diet to be advanced  Allergies: Review of patient's allergies indicates no known allergies.  Medications:  Current Facility-Administered Medications:  .  acetaminophen (TYLENOL) tablet 650 mg, 650 mg, Oral, Q6H PRN **OR** acetaminophen (TYLENOL) suppository 650 mg, 650 mg, Rectal, Q6H PRN, Lorretta Harp, MD .  amoxicillin-clavulanate (AUGMENTIN) 875-125 MG per tablet 1 tablet, 1 tablet, Oral, Q12H, Leroy Sea, MD .  enoxaparin (LOVENOX) injection 40 mg, 40 mg, Subcutaneous, Daily, Deirdre Evener Wofford, RPH, 40 mg at 07/26/15 1045 .  morphine 2 MG/ML injection 2 mg, 2 mg, Intravenous, Q4H PRN, Lorretta Harp, MD .  ondansetron [facility]) injection 4 mg, 4 mg, Intravenous, Q8H PRN, Lorretta Harp, MD, 4 mg at 07/27/15 0300 .  oxyCODONE-acetaminophen (PERCOCET/ROXICET) 5-325 MG per tablet 2 tablet, 2 tablet, Oral, Q6H PRN, Lorretta Harp, MD, 1 tablet at 07/27/15 0445   Vital Signs: BP 115/70 (BP Location: Right Arm)   Pulse 61   Temp 98.5 F (36.9 C) (Oral)   Resp 19   Ht 5\' 8"  (1.727 m)   Wt 169 lb (76.7 kg)   SpO2 93%   BMI 25.70 kg/m   Physical Exam: Abd: soft, currently NT, drain with minimal serous drainage   Drain site is c/d/i  Imaging: Ct Image Guided Drainage By Percutaneous Catheter  Result Date: 07/24/2015 INDICATION: Left lower quadrant abscess EXAM: CT GUIDED DRAINAGE OF LEFT LOWER QUADRANT DIVERTICULAR ABSCESS MEDICATIONS: The patient is currently admitted to the hospital and receiving intravenous antibiotics. The antibiotics were administered within an appropriate time frame prior to the initiation of the procedure. ANESTHESIA/SEDATION: Three mg IV Versed 50 mcg IV Fentanyl Moderate  Sedation Time:  15 The patient was continuously monitored during the procedure by the interventional radiology nurse under my direct supervision. COMPLICATIONS: None immediate. TECHNIQUE: Informed written consent was obtained from the patient after a thorough discussion of the procedural risks, benefits and alternatives. All questions were addressed. Maximal Sterile Barrier Technique was utilized including caps, mask, sterile gowns, sterile gloves, sterile drape, hand hygiene and skin antiseptic. A timeout was performed prior to the initiation of the procedure. PROCEDURE: The left lower quadrant was prepped with ChloraPrep in a sterile fashion, and a sterile drape was applied covering the operative field. A sterile gown and sterile gloves were used for the procedure. Local anesthesia was provided with 1% Lidocaine. Under CT guidance, an 18 gauge needle was inserted into the left lower quadrant abscess. It was removed over an Amplatz wire. A 10 French dilator followed by a 10 [place] drain was then inserted into the fluid collection. Was looped and string fixed, then sewn to the skin. Frank pus was aspirated. FINDINGS: Images document 93 French drain placement into a left lower quadrant diverticular abscess. IMPRESSION: Successful left lower quadrant diverticular abscess 10 French drain. Electronically Signed   By: Jolaine Click M.D.   On: 07/24/2015 17:29    Labs:  CBC:  Recent Labs  07/22/15 1514 07/23/15 0129 07/24/15 0522 07/26/15 0546  WBC 14.8* 15.0* 11.7* 7.3  HGB 16.4 14.4 13.3 14.6  HCT 46.4 41.9 38.7* 42.6  PLT  --  213 215 288    COAGS:  Recent Labs  07/22/15 2217  INR 1.26  APTT 29  BMP:  Recent Labs  07/22/15 1451 07/22/15 1936 07/23/15 0129 07/26/15 0546 07/27/15 0542  NA 140 135 136 136  --   K 5.0 3.8 3.7 3.4* 4.4  CL 101 101 106 103  --   CO2 --   GLUCOSE 89 98 96 86  --   BUN --   CALCIUM 9.5 9.2 8.1* 8.5*  --   CREATININE 1.07 0.95  0.90 0.71  --   GFRNONAA 76 >60 >60 >60  --   GFRAA 87 >60 >60 >60  --     LIVER FUNCTION TESTS:  Recent Labs  07/22/15 1451  BILITOT 1.3*  AST 17  ALT 23  ALKPHOS 46  PROT 7.0  ALBUMIN 4.4    Assessment and Plan: 1. Diverticular abscess, s/p perc drain on 7/20, [place] -cont drain for now -cont flushes, follow output -will need repeat CT in about 5-7 days if output remains under 15cc/day -will follow  Electronically Signed: Brayton El 07/27/2015, 11:50 AM   I spent a total of 15 Minutes at the the patient's bedside AND on the patient's hospital floor or unit, greater than 50% of which was counseling/coordinating care for diverticular abscess

## 2015-07-27 NOTE — Progress Notes (Signed)
PROGRESS NOTE        PATIENT DETAILS Name: Marcus [name] Age: 60 y.o. Sex: male Date of Birth: 1955-11-02 Admit Date: 07/22/2015 Admitting Physician Lorretta Harp, MD ZOX:[id number] Nedra Hai, PA-C  Brief Narrative:  Patient is a 60 y.o. male with no past medical history admitted with abdominal pain, found to have diverticulitis with perforation and a small abscess. There went CT guided Drain placement on 7/20, improving slowly.  Subjective:  Much improved pain in the left lower quadrant area. No N&V, no chest pain or SOB.  Assessment/Plan:  Perforated diverticulitis of the sigmoid colon with a small abscess: Status post CT-guided drainage by interventional radiology on 7/20.Slowly improving-diet being advanced by general surgery, will switch to Augmentin x 10 days on 07-27-15, per [facility] JP would most likely be pulled by IR as it is not draining, likely DC home in am if stable.   Incidental finding of 4 mm low-attenuation mass in pancreatic head on CT scan abdomen: Per radiology this is likely a small cyst. Recommending an MRI in 1 year to ensure stability.   DVT Prophylaxis: Prophylactic Lovenox   Code Status: Full code   Family Communication: None at bedside  Disposition Plan: Remain inpatient-home likely over the weekend  Procedures: None  CONSULTS:  general surgery  Time spent: 25 minutes-Greater than 50% of this time was spent in counseling, explanation of diagnosis, planning of further management, and coordination of care.  MEDICATIONS: Anti-infectives    Start     Dose/Rate Route Frequency Ordered Stop   07/27/15 1100  amoxicillin-clavulanate (AUGMENTIN) 875-125 MG per tablet 1 tablet     1 tablet Oral Every 12 hours 07/27/15 0644     07/23/15 1000  ciprofloxacin (CIPRO) IVPB 400 mg  Status:  Discontinued     400 mg 200 mL/hr over 60 Minutes Intravenous 2 times daily 07/22/15 2130 07/23/15 0835   07/23/15 0900  cefTRIAXone (ROCEPHIN) 2  g in dextrose 5 % 50 mL IVPB     2 g 100 mL/hr over 30 Minutes Intravenous Daily 07/23/15 0835 07/27/15 1100   07/23/15 0400  metroNIDAZOLE (FLAGYL) IVPB 500 mg     500 mg 100 mL/hr over 60 Minutes Intravenous Every 8 hours 07/22/15 2130 07/27/15 0500   07/22/15 2200  ciprofloxacin (CIPRO) IVPB 400 mg  Status:  Discontinued     400 mg 200 mL/hr over 60 Minutes Intravenous  Once 07/22/15 2100 07/23/15 0838   07/22/15 2115  metroNIDAZOLE (FLAGYL) IVPB 500 mg  Status:  Discontinued     500 mg 100 mL/hr over 60 Minutes Intravenous Every 8 hours 07/22/15 2100 07/22/15 2130   07/22/15 1930  ciprofloxacin (CIPRO) IVPB 400 mg     400 mg 200 mL/hr over 60 Minutes Intravenous  Once 07/22/15 1929 07/22/15 2054   07/22/15 1930  metroNIDAZOLE (FLAGYL) IVPB 500 mg     500 mg 100 mL/hr over 60 Minutes Intravenous  Once 07/22/15 1929 07/22/15 2154      Scheduled Meds: . amoxicillin-clavulanate  1 tablet Oral Q12H  . cefTRIAXone (ROCEPHIN)  IV  2 g Intravenous Daily  . enoxaparin (LOVENOX) injection  40 mg Subcutaneous Daily   Continuous Infusions:   PRN Meds:.acetaminophen **OR** acetaminophen, morphine injection, ondansetron, oxyCODONE-acetaminophen   PHYSICAL EXAM: Vital signs: Vitals:   07/26/15 0500 07/26/15 1538 07/26/15 2144 07/27/15 0500  BP: 117/80 119/81 120/68 115/70  Pulse: 62 64 63 61  Resp: 18 16 18 19   Temp: 97.7 F (36.5 C) 98.8 F (37.1 C) 99 F (37.2 C) 98.5 F (36.9 C)  TempSrc: Oral Oral Oral Oral  SpO2: 95% 94% 94% 93%  Weight:      Height:       Filed Weights   07/22/15 1934  Weight: 76.7 kg (169 lb)   Body mass index is 25.7 kg/m.   Gen Exam: Awake and alert with clear speech. Not in any distress  Neck: Supple, No JVD.   Chest: B/L Clear.   CVS: S1 S2 Regular, no murmurs.  Abdomen: soft, BS +, Moderately Tender left lower quadrant without any peritoneal signs. Has a left lower quadrant drain in place Extremities: no edema, lower extremities warm to  touch. Neurologic: Non Focal.   Skin: No Rash or lesions   Wounds: N/A.    LABORATORY DATA: CBC:  Recent Labs Lab 07/22/15 1514 07/23/15 0129 07/24/15 0522 07/26/15 0546  WBC 14.8* 15.0* 11.7* 7.3  HGB 16.4 14.4 13.3 14.6  HCT 46.4 41.9 38.7* 42.6  MCV 88.8 88.0 89.0 87.5  PLT  --  213 215 288    Basic Metabolic Panel:  Recent Labs Lab 07/22/15 1451 07/22/15 1936 07/23/15 0129 07/26/15 0546 07/27/15 0542  NA 140 135 136 136  --   K 5.0 3.8 3.7 3.4* 4.4  CL 101 101 106 103  --   CO2 26 23 25 26   --   GLUCOSE 89 98 96 86  --   BUN 11 10 9 8   --   CREATININE 1.07 0.95 0.90 0.71  --   CALCIUM 9.5 9.2 8.1* 8.5*  --     GFR: Estimated Creatinine Clearance: 96.2 mL/min (by C-G formula based on SCr of 0.8 mg/dL).  Liver Function Tests:  Recent Labs Lab 07/22/15 1451  AST 17  ALT 23  ALKPHOS 46  BILITOT 1.3*  PROT 7.0  ALBUMIN 4.4    Recent Labs Lab 07/22/15 1451  LIPASE 20   No results for input(s): AMMONIA in the last 168 hours.  Coagulation Profile:  Recent Labs Lab 07/22/15 2217  INR 1.26    Cardiac Enzymes: No results for input(s): CKTOTAL, CKMB, CKMBINDEX, TROPONINI in the last 168 hours.  BNP (last 3 results) No results for input(s): PROBNP in the last 8760 hours.  HbA1C: No results for input(s): HGBA1C in the last 72 hours.  CBG:  Recent Labs Lab 07/23/15 0726 07/24/15 0743 07/25/15 0736 07/26/15 0743 07/27/15 0746  GLUCAP 85 79 85 82 81    Lipid Profile: No results for input(s): CHOL, HDL, LDLCALC, TRIG, CHOLHDL, LDLDIRECT in the last 72 hours.  Thyroid Function Tests: No results for input(s): TSH, T4TOTAL, FREET4, T3FREE, THYROIDAB in the last 72 hours.  Anemia Panel: No results for input(s): VITAMINB12, FOLATE, FERRITIN, TIBC, IRON, RETICCTPCT in the last 72 hours.  Urine analysis:    Component Value Date/Time   BILIRUBINUR negative 07/22/2015 1458   KETONESUR negative 07/22/2015 1458   PROTEINUR negative  07/22/2015 1458   UROBILINOGEN 0.2 07/22/2015 1458   NITRITE Negative 07/22/2015 1458   LEUKOCYTESUR Negative 07/22/2015 1458    Sepsis Labs: Lactic Acid, Venous    Component Value Date/Time   LATICACIDVEN 0.8 07/23/2015 0129    MICROBIOLOGY: Recent Results (from the past 240 hour(s))  Culture, blood (x 2)     Status: None (Preliminary result)   Collection Time: 07/22/15 10:17 PM  Result Value Ref Range Status  Specimen Description RIGHT ANTECUBITAL  Final   Special Requests BOTTLES DRAWN AEROBIC AND ANAEROBIC 5CC  Final   Culture   Final    NO GROWTH 4 DAYS Performed at [facility]    Report Status PENDING  Incomplete  Culture, blood (x 2)     Status: None (Preliminary result)   Collection Time: 07/22/15 10:17 PM  Result Value Ref Range Status   Specimen Description BLOOD LEFT HAND  Final   Special Requests IN PEDIATRIC BOTTLE 1.5CC  Final   Culture   Final    NO GROWTH 4 DAYS Performed at [facility]    Report Status PENDING  Incomplete  Aerobic/Anaerobic Culture (surgical/deep wound)     Status: None (Preliminary result)   Collection Time: 07/24/15  5:35 PM  Result Value Ref Range Status   Specimen Description ABSCESS ABDOMEN LEFT LOWER QUAD  Final   Special Requests NONE  Final   Gram Stain   Final    ABUNDANT WBC PRESENT, PREDOMINANTLY PMN NO ORGANISMS SEEN Performed at [facility]    Culture   Final    FEW GRAM NEGATIVE RODS NO ANAEROBES ISOLATED; CULTURE IN PROGRESS FOR 5 DAYS    Report Status PENDING  Incomplete    RADIOLOGY STUDIES/RESULTS: Ct Abdomen Pelvis W Contrast  Result Date: 07/22/2015 CLINICAL DATA:  Left lower quadrant pain. EXAM: CT ABDOMEN AND PELVIS WITH CONTRAST TECHNIQUE: Multidetector CT imaging of the abdomen and pelvis was performed using the standard protocol following bolus administration of intravenous contrast. CONTRAST:  ISOVUE-300 IOPAMIDOL (ISOVUE-300) INJECTION 61% COMPARISON:  KUB from earlier  today FINDINGS: Lower chest:  No acute findings. Hepatobiliary: There is 15 mm low-attenuation mass in the right hepatic lobe on series 2, image 22 which is too small to characterize but most likely either a cyst or hemangioma. Hepatic steatosis is identified. The liver, portal vein, and gallbladder are otherwise normal. Pancreas: There is a tiny low-attenuation lesion measuring 4 mm in the pancreatic head on series 2, image 25. The attenuation in this lesion is 3 Hounsfield units most consistent with a small cyst. The remainder of the pancreas is normal in appearance. Spleen: Within normal limits in size and appearance. Adrenals/Urinary Tract: No masses identified. No evidence of hydronephrosis. Stomach/Bowel: The stomach small bowel are normal. Diverticuli are seen throughout colon, particularly in the sigmoid colon. There is fat stranding adjacent to the sigmoid colon with extraluminal gas and fluid seen on series 2, image 64 measuring 4.6 x 2.0 cm, consistent with perforated diverticulitis. There is mild associated bowel wall thickening in this region. The remainder of the colon and appendix are normal in appearance. Vascular/Lymphatic: There is a fat containing umbilical hernia. The abdominal aorta is normal. No adenopathy. Reproductive: No mass or other significant abnormality. Other: None. Musculoskeletal: Degenerative changes are seen in the SI joints. No other bony abnormalities. IMPRESSION: 1. Perforated sigmoid diverticulitis with a 4.6 x 2.0 cm collection of extraluminal gas and air. No free air. 2. 4 mm low-attenuation mass in pancreatic head, likely a small cyst. Recommend an MRI in 1 year to ensure stability. 3. No other acute abnormalities. The findings were called to the patient's physician assistant, Deliah Boston. Electronically Signed   By: Gerome Sam III M.D   On: 07/22/2015 18:26   Dg Abd 2 Views  Result Date: 07/22/2015 CLINICAL DATA:  Left lower abdomen pain radiating to the groin  EXAM: ABDOMEN - 2 VIEW COMPARISON:  None. FINDINGS: No opaque renal or  ureteral calculi are noted by plain film. The bowel gas pattern is nonspecific. No bony abnormality is seen. IMPRESSION: No opaque calculi are noted.  The bowel gas pattern is nonspecific. Electronically Signed   By: Dwyane Dee M.D.   On: 07/22/2015 15:11   Ct Image Guided Drainage By Percutaneous Catheter  Result Date: 07/24/2015 INDICATION: Left lower quadrant abscess EXAM: CT GUIDED DRAINAGE OF LEFT LOWER QUADRANT DIVERTICULAR ABSCESS MEDICATIONS: The patient is currently admitted to the hospital and receiving intravenous antibiotics. The antibiotics were administered within an appropriate time frame prior to the initiation of the procedure. ANESTHESIA/SEDATION: Three mg IV Versed 50 mcg IV Fentanyl Moderate Sedation Time:  15 The patient was continuously monitored during the procedure by the interventional radiology nurse under my direct supervision. COMPLICATIONS: None immediate. TECHNIQUE: Informed written consent was obtained from the patient after a thorough discussion of the procedural risks, benefits and alternatives. All questions were addressed. Maximal Sterile Barrier Technique was utilized including caps, mask, sterile gowns, sterile gloves, sterile drape, hand hygiene and skin antiseptic. A timeout was performed prior to the initiation of the procedure. PROCEDURE: The left lower quadrant was prepped with ChloraPrep in a sterile fashion, and a sterile drape was applied covering the operative field. A sterile gown and sterile gloves were used for the procedure. Local anesthesia was provided with 1% Lidocaine. Under CT guidance, an 18 gauge needle was inserted into the left lower quadrant abscess. It was removed over an Amplatz wire. A 10 French dilator followed by a 10 [place] drain was then inserted into the fluid collection. Was looped and string fixed, then sewn to the skin. Frank pus was aspirated. FINDINGS: Images document  35 French drain placement into a left lower quadrant diverticular abscess. IMPRESSION: Successful left lower quadrant diverticular abscess 10 French drain. Electronically Signed   By: Jolaine Click M.D.   On: 07/24/2015 17:29     LOS: 5 days   Leroy Sea, MD  [facility] Pager:[phone]  If 7PM-7AM, please contact night-coverage [email or web address] Password [email or web address] 07/27/2015, 10:31 AM

## 2015-07-28 ENCOUNTER — Other Ambulatory Visit: Payer: Self-pay | Admitting: Radiology

## 2015-07-28 ENCOUNTER — Other Ambulatory Visit: Payer: Self-pay | Admitting: General Surgery

## 2015-07-28 DIAGNOSIS — K5720 Diverticulitis of large intestine with perforation and abscess without bleeding: Secondary | ICD-10-CM

## 2015-07-28 LAB — GLUCOSE, CAPILLARY: Glucose-Capillary: 91 mg/dL (ref 65–99)

## 2015-07-28 MED ORDER — AMOXICILLIN-POT CLAVULANATE 875-125 MG PO TABS: 1.0000 | ORAL_TABLET | Freq: Two times a day (BID) | ORAL | 0 refills | Status: DC

## 2015-07-28 MED ORDER — OXYCODONE-ACETAMINOPHEN 5-325 MG PO TABS: 2.0000 | ORAL_TABLET | Freq: Three times a day (TID) | ORAL | 0 refills | Status: DC | PRN

## 2015-07-28 MED ORDER — CIPROFLOXACIN HCL 500 MG PO TABS: 500.0000 mg | ORAL_TABLET | Freq: Two times a day (BID) | ORAL | 0 refills | Status: DC

## 2015-07-28 MED ORDER — SACCHAROMYCES BOULARDII 250 MG PO CAPS: 250.0000 mg | ORAL_CAPSULE | Freq: Two times a day (BID) | ORAL | 0 refills | Status: DC

## 2015-07-28 MED ORDER — ONDANSETRON HCL 4 MG PO TABS: 4.0000 mg | ORAL_TABLET | Freq: Three times a day (TID) | ORAL | 0 refills | Status: DC | PRN

## 2015-07-28 NOTE — Progress Notes (Signed)
  Subjective: No complaints. No n.v. Tolerating  A diet. Some discomfort around drain site. Only flush coming out of drain.   Objective: Vital signs in last 24 hours: Temp:  [98 F (36.7 C)-98.6 F (37 C)] 98 F (36.7 C) (07/24 0449) Pulse Rate:  [60-67] 60 (07/24 0449) Resp:  [17-19] 17 (07/24 0449) BP: (117-119)/(71-77) 118/77 (07/24 0449) SpO2:  [94 %] 94 % (07/24 0449) Last BM Date: 07/24/15  Intake/Output from previous day: 07/23 0701 - 07/24 0700 In: 635 [P.O.:630] Out: -  Intake/Output this shift: No intake/output data recorded.  Nad, nontoxic Soft, nt, nd. Drain - serous Culture - NTD. No org  Lab Results:   Recent Labs  07/26/15 0546  WBC 7.3  HGB 14.6  HCT 42.6  PLT 288   BMET  Recent Labs  07/26/15 0546 07/27/15 0542  NA 136  --   K 3.4* 4.4  CL 103  --   CO2 26  --   GLUCOSE 86  --   BUN 8  --   CREATININE 0.71  --   CALCIUM 8.5*  --    PT/INR No results for input(s): LABPROT, INR in the last 72 hours. ABG No results for input(s): PHART, HCO3 in the last 72 hours.  Invalid input(s): PCO2, PO2  Studies/Results: No results found.  Anti-infectives: Anti-infectives    Start     Dose/Rate Route Frequency Ordered Stop   07/27/15 1100  amoxicillin-clavulanate (AUGMENTIN) 875-125 MG per tablet 1 tablet     1 tablet Oral Every 12 hours 07/27/15 0644     07/23/15 1000  ciprofloxacin (CIPRO) IVPB 400 mg  Status:  Discontinued     400 mg 200 mL/hr over 60 Minutes Intravenous 2 times daily 07/22/15 2130 07/23/15 0835   07/23/15 0900  cefTRIAXone (ROCEPHIN) 2 g in dextrose 5 % 50 mL IVPB     2 g 100 mL/hr over 30 Minutes Intravenous Daily 07/23/15 0835 07/27/15 1030   07/23/15 0400  metroNIDAZOLE (FLAGYL) IVPB 500 mg     500 mg 100 mL/hr over 60 Minutes Intravenous Every 8 hours 07/22/15 2130 07/27/15 0500   07/22/15 2200  ciprofloxacin (CIPRO) IVPB 400 mg  Status:  Discontinued     400 mg 200 mL/hr over 60 Minutes Intravenous  Once  07/22/15 2100 07/23/15 0838   07/22/15 2115  metroNIDAZOLE (FLAGYL) IVPB 500 mg  Status:  Discontinued     500 mg 100 mL/hr over 60 Minutes Intravenous Every 8 hours 07/22/15 2100 07/22/15 2130   07/22/15 1930  ciprofloxacin (CIPRO) IVPB 400 mg     400 mg 200 mL/hr over 60 Minutes Intravenous  Once 07/22/15 1929 07/22/15 2054   07/22/15 1930  metroNIDAZOLE (FLAGYL) IVPB 500 mg     500 mg 100 mL/hr over 60 Minutes Intravenous  Once 07/22/15 1929 07/22/15 2154      Assessment/Plan: Sigmoid diverticulitis with abscess s/p drain  No fever, no tachy, no wbc. Tolerating diet.   Can go home from our perspective Would send out with drain and have pt f/u with IR drain clinic with f/u CT  can f/u with Dr Johna Sheriff at [facility] Needs 10 days more of oral abx Discussed what to call for [facility] teaching Low residue for now Resume high fiber diet in about 6 weeks  Mary Sella. Andrey Campanile, MD, FACS General, Bariatric, & Minimally Invasive Surgery [facility], [place]   LOS: 6 days    Atilano Ina 07/28/2015

## 2015-07-28 NOTE — Discharge Instructions (Signed)
Follow with Primary MD Dolores Lory, PA-C in 3-4 days   Get CBC, CMP, 2 view Chest X ray checked  by Primary MD or SNF MD in 5-7 days ( we routinely change or add medications that can affect your baseline labs and fluid status, therefore we recommend that you get the mentioned basic workup next visit with your PCP, your PCP may decide not to get them or add new tests based on their clinical decision)   Activity: As tolerated with Full fall precautions use walker/cane & assistance as needed   Disposition Home     Diet:   Soft diet.  For Heart failure patients - Check your Weight same time everyday, if you gain over 2 pounds, or you develop in leg swelling, experience more shortness of breath or chest pain, call your Primary MD immediately. Follow Cardiac Low Salt Diet and 1.5 lit/day fluid restriction.   On your next visit with your primary care physician please Get Medicines reviewed and adjusted.   Please request your Prim.MD to go over all Hospital Tests and Procedure/Radiological results at the follow up, please get all Hospital records sent to your Prim MD by signing hospital release before you go home.   If you experience worsening of your admission symptoms, develop shortness of breath, life threatening emergency, suicidal or homicidal thoughts you must seek medical attention immediately by calling 911 or calling your MD immediately  if symptoms less severe.  You Must read complete instructions/literature along with all the possible adverse reactions/side effects for all the Medicines you take and that have been prescribed to you. Take any new Medicines after you have completely understood and accpet all the possible adverse reactions/side effects.   Do not drive, operate heavy machinery, perform activities at heights, swimming or participation in water activities or provide baby sitting services if your were admitted for syncope or siezures until you have seen by Primary MD or a  Neurologist and advised to do so again.  Do not drive when taking Pain medications.    Do not take more than prescribed Pain, Sleep and Anxiety Medications  Special Instructions: If you have smoked or chewed Tobacco  in the last 2 yrs please stop smoking, stop any regular Alcohol  and or any Recreational drug use.  Wear Seat belts while driving.   Please note  You were cared for by a hospitalist during your hospital stay. If you have any questions about your discharge medications or the care you received while you were in the hospital after you are discharged, you can call the unit and asked to speak with the hospitalist on call if the hospitalist that took care of you is not available. Once you are discharged, your primary care physician will handle any further medical issues. Please note that NO REFILLS for any discharge medications will be authorized once you are discharged, as it is imperative that you return to your primary care physician (or establish a relationship with a primary care physician if you do not have one) for your aftercare needs so that they can reassess your need for medications and monitor your lab values.

## 2015-07-28 NOTE — Discharge Summary (Addendum)
Marcus [name] HKV:[id number] DOB: [birth year] DOA: 07/22/2015  PCP: Dolores Lory, PA-C  Admit date: 07/22/2015  Discharge date: 07/28/2015  Admitted From: Home   Disposition:  Home   Recommendations for Outpatient Follow-up:   Follow up with PCP in 1-2 weeks  PCP Please obtain BMP/CBC, 2 view CXR in 1week,  (see Discharge instructions)   PCP Please follow up on the following pending results: Kindly review CT scan results, needs pancreatic MRI and outpatient GI follow-up as well.   Home Health: None   Equipment/Devices: None  Consultations: Surgery, IR Discharge Condition: Stable   CODE STATUS: Full   Diet Recommendation: Soft   Chief Complaint  Patient presents with  . Abdominal Pain     Brief history of present illness from the day of admission and additional interim summary    Patient is a 60 y.o. male with no past medical history admitted with abdominal pain, found to have diverticulitis with perforation and a small abscess. There went CT guided Drain placement on 7/20, improving slowly.   Hospital issues addressed    Perforated diverticulitis of the sigmoid colon with a small abscess: Status post CT-guided drainage by interventional radiology on 7/20.Slowly improving-diet being advanced by general surgery, will switch to Cipro + pro biotic x 10 days on 07-28-15, per [facility] stable to go home, discussed with Dr. Denny Levy IR today, someone from IR will come and educate him about follow-up appointment imaging and maintenance of the JP drain . Patient today is symptom-free, tolerating diet and eager to go home. Wound culture growing pan sensitive Pseudomonas.  Incidental finding of 4 mm low-attenuation mass in pancreatic head on CT scan abdomen: Per radiology this is likely a small cyst. Recommending an  MRI in 1 year to ensure stability. We'll recommend one-time outpatient GI follow-up as well to PCP.   Discharge diagnosis     Principal Problem:   Perforated sigmoid colon (HCC) Active Problems:   Mass of pancreas   Diverticulitis    Discharge instructions    Discharge Instructions    Discharge instructions    Complete by:  As directed   Follow with Primary MD Dolores Lory, PA-C in 3-4 days   Get CBC, CMP, 2 view Chest X ray checked  by Primary MD or SNF MD in 5-7 days ( we routinely change or add medications that can affect your baseline labs and fluid status, therefore we recommend that you get the mentioned basic workup next visit with your PCP, your PCP may decide not to get them or add new tests based on their clinical decision)   Activity: As tolerated with Full fall precautions use walker/cane & assistance as needed   Disposition Home     Diet:   Soft diet.  For Heart failure patients - Check your Weight same time everyday, if you gain over 2 pounds, or you develop in leg swelling, experience more shortness of breath or chest pain, call your Primary MD immediately. Follow Cardiac Low Salt Diet and 1.5 lit/day fluid restriction.  On your next visit with your primary care physician please Get Medicines reviewed and adjusted.   Please request your Prim.MD to go over all Hospital Tests and Procedure/Radiological results at the follow up, please get all Hospital records sent to your Prim MD by signing hospital release before you go home.   If you experience worsening of your admission symptoms, develop shortness of breath, life threatening emergency, suicidal or homicidal thoughts you must seek medical attention immediately by calling 911 or calling your MD immediately  if symptoms less severe.  You Must read complete instructions/literature along with all the possible adverse reactions/side effects for all the Medicines you take and that have been prescribed to you.  Take any new Medicines after you have completely understood and accpet all the possible adverse reactions/side effects.   Do not drive, operate heavy machinery, perform activities at heights, swimming or participation in water activities or provide baby sitting services if your were admitted for syncope or siezures until you have seen by Primary MD or a Neurologist and advised to do so again.  Do not drive when taking Pain medications.    Do not take more than prescribed Pain, Sleep and Anxiety Medications  Special Instructions: If you have smoked or chewed Tobacco  in the last 2 yrs please stop smoking, stop any regular Alcohol  and or any Recreational drug use.  Wear Seat belts while driving.   Please note  You were cared for by a hospitalist during your hospital stay. If you have any questions about your discharge medications or the care you received while you were in the hospital after you are discharged, you can call the unit and asked to speak with the hospitalist on call if the hospitalist that took care of you is not available. Once you are discharged, your primary care physician will handle any further medical issues. Please note that NO REFILLS for any discharge medications will be authorized once you are discharged, as it is imperative that you return to your primary care physician (or establish a relationship with a primary care physician if you do not have one) for your aftercare needs so that they can reassess your need for medications and monitor your lab values.   Increase activity slowly    Complete by:  As directed      Discharge Medications     Medication List    TAKE these medications   acetaminophen 500 MG tablet Commonly known as:  TYLENOL Take 500 mg by mouth every 6 (six) hours as needed for moderate pain.   ciprofloxacin 500 MG tablet Commonly known as:  CIPRO Take 1 tablet (500 mg total) by mouth 2 (two) times daily.   ibuprofen 200 MG tablet Commonly  known as:  ADVIL,MOTRIN Take 400 mg by mouth every 6 (six) hours as needed for moderate pain.   ondansetron 4 MG tablet Commonly known as:  ZOFRAN Take 1 tablet (4 mg total) by mouth every 8 (eight) hours as needed for nausea or vomiting.   oxyCODONE-acetaminophen 5-325 MG tablet Commonly known as:  PERCOCET/ROXICET Take 2 tablets by mouth every 8 (eight) hours as needed for severe pain.   saccharomyces boulardii 250 MG capsule Commonly known as:  FLORASTOR Take 1 capsule (250 mg total) by mouth 2 (two) times daily.       No Known Allergies  Follow-up Information    Dolores Lory, PA-C. Schedule an appointment as soon as possible for a visit in 3 day(s).   Specialty:  Urgent Care Contact information: [address] [place] [address] [phone]        Mariella Saa, MD. Schedule an appointment as soon as possible for a visit in 1 week(s).   Specialty:  General Surgery Contact information: [address] [place] [address] [phone]        [facility] T, MD. Schedule an appointment as soon as possible for a visit in 4 day(s).   Specialty:  Interventional Radiology Why:  Removal of your drain Contact information: [address] [place] [address] [phone]        Stan Head, MD. Schedule an appointment as soon as possible for a visit in 4 week(s).   Specialty:  Gastroenterology Why:  For pancreatic mass/cyst needing MRI abdomen Contact information: [address] [place] [address] [phone]           Major procedures and Radiology Reports - PLEASE review detailed and final reports thoroughly  -        Ct Abdomen Pelvis W Contrast  Result Date: 07/22/2015 CLINICAL DATA:  Left lower quadrant pain. EXAM: CT ABDOMEN AND PELVIS WITH CONTRAST TECHNIQUE: Multidetector CT imaging of the abdomen and pelvis was performed using the standard protocol following bolus administration of intravenous  contrast. CONTRAST:  ISOVUE-300 IOPAMIDOL (ISOVUE-300) INJECTION 61% COMPARISON:  KUB from earlier today FINDINGS: Lower chest:  No acute findings. Hepatobiliary: There is 15 mm low-attenuation mass in the right hepatic lobe on series 2, image 22 which is too small to characterize but most likely either a cyst or hemangioma. Hepatic steatosis is identified. The liver, portal vein, and gallbladder are otherwise normal. Pancreas: There is a tiny low-attenuation lesion measuring 4 mm in the pancreatic head on series 2, image 25. The attenuation in this lesion is 3 Hounsfield units most consistent with a small cyst. The remainder of the pancreas is normal in appearance. Spleen: Within normal limits in size and appearance. Adrenals/Urinary Tract: No masses identified. No evidence of hydronephrosis. Stomach/Bowel: The stomach small bowel are normal. Diverticuli are seen throughout colon, particularly in the sigmoid colon. There is fat stranding adjacent to the sigmoid colon with extraluminal gas and fluid seen on series 2, image 64 measuring 4.6 x 2.0 cm, consistent with perforated diverticulitis. There is mild associated bowel wall thickening in this region. The remainder of the colon and appendix are normal in appearance. Vascular/Lymphatic: There is a fat containing umbilical hernia. The abdominal aorta is normal. No adenopathy. Reproductive: No mass or other significant abnormality. Other: None. Musculoskeletal: Degenerative changes are seen in the SI joints. No other bony abnormalities. IMPRESSION: 1. Perforated sigmoid diverticulitis with a 4.6 x 2.0 cm collection of extraluminal gas and air. No free air. 2. 4 mm low-attenuation mass in pancreatic head, likely a small cyst. Recommend an MRI in 1 year to ensure stability. 3. No other acute abnormalities. The findings were called to the patient's physician assistant, Deliah Boston. Electronically Signed   By: Gerome Sam III M.D   On: 07/22/2015 18:26   Dg  Abd 2 Views  Result Date: 07/22/2015 CLINICAL DATA:  Left lower abdomen pain radiating to the groin EXAM: ABDOMEN - 2 VIEW COMPARISON:  None. FINDINGS: No opaque renal or ureteral calculi are noted by plain film. The bowel gas pattern is nonspecific. No bony abnormality is seen. IMPRESSION: No opaque calculi are noted.  The bowel gas pattern is nonspecific. Electronically Signed   By: Lucienne Minks.D.  On: 07/22/2015 15:11   Ct Image Guided Drainage By Percutaneous Catheter  Result Date: 07/24/2015 INDICATION: Left lower quadrant abscess EXAM: CT GUIDED DRAINAGE OF LEFT LOWER QUADRANT DIVERTICULAR ABSCESS MEDICATIONS: The patient is currently admitted to the hospital and receiving intravenous antibiotics. The antibiotics were administered within an appropriate time frame prior to the initiation of the procedure. ANESTHESIA/SEDATION: Three mg IV Versed 50 mcg IV Fentanyl Moderate Sedation Time:  15 The patient was continuously monitored during the procedure by the interventional radiology nurse under my direct supervision. COMPLICATIONS: None immediate. TECHNIQUE: Informed written consent was obtained from the patient after a thorough discussion of the procedural risks, benefits and alternatives. All questions were addressed. Maximal Sterile Barrier Technique was utilized including caps, mask, sterile gowns, sterile gloves, sterile drape, hand hygiene and skin antiseptic. A timeout was performed prior to the initiation of the procedure. PROCEDURE: The left lower quadrant was prepped with ChloraPrep in a sterile fashion, and a sterile drape was applied covering the operative field. A sterile gown and sterile gloves were used for the procedure. Local anesthesia was provided with 1% Lidocaine. Under CT guidance, an 18 gauge needle was inserted into the left lower quadrant abscess. It was removed over an Amplatz wire. A 10 French dilator followed by a 10 [place] drain was then inserted into the fluid collection.  Was looped and string fixed, then sewn to the skin. Frank pus was aspirated. FINDINGS: Images document 25 French drain placement into a left lower quadrant diverticular abscess. IMPRESSION: Successful left lower quadrant diverticular abscess 10 French drain. Electronically Signed   By: Jolaine Click M.D.   On: 07/24/2015 17:29    Micro Results   Recent Results (from the past 240 hour(s))  Culture, blood (x 2)     Status: None   Collection Time: 07/22/15 10:17 PM  Result Value Ref Range Status   Specimen Description RIGHT ANTECUBITAL  Final   Special Requests BOTTLES DRAWN AEROBIC AND ANAEROBIC 5CC  Final   Culture   Final    NO GROWTH 5 DAYS Performed at [facility]    Report Status 07/27/2015 FINAL  Final  Culture, blood (x 2)     Status: None   Collection Time: 07/22/15 10:17 PM  Result Value Ref Range Status   Specimen Description BLOOD LEFT HAND  Final   Special Requests IN PEDIATRIC BOTTLE 1.5CC  Final   Culture   Final    NO GROWTH 5 DAYS Performed at [facility]    Report Status 07/27/2015 FINAL  Final  Aerobic/Anaerobic Culture (surgical/deep wound)     Status: None (Preliminary result)   Collection Time: 07/24/15  5:35 PM  Result Value Ref Range Status   Specimen Description ABSCESS ABDOMEN LEFT LOWER QUAD  Final   Special Requests NONE  Final   Gram Stain   Final    ABUNDANT WBC PRESENT, PREDOMINANTLY PMN NO ORGANISMS SEEN Performed at [facility]    Culture   Final    FEW PSEUDOMONAS AERUGINOSA NO ANAEROBES ISOLATED; CULTURE IN PROGRESS FOR 5 DAYS    Report Status PENDING  Incomplete   Organism ID, Bacteria PSEUDOMONAS AERUGINOSA  Final      Susceptibility   Pseudomonas aeruginosa - MIC*    CEFTAZIDIME 8 SENSITIVE Sensitive     CIPROFLOXACIN <=0.25 SENSITIVE Sensitive     GENTAMICIN 4 SENSITIVE Sensitive     IMIPENEM 2 SENSITIVE Sensitive     PIP/TAZO 64 SENSITIVE Sensitive     CEFEPIME  4 SENSITIVE Sensitive     * FEW PSEUDOMONAS  AERUGINOSA    Today   Subjective    Marcus [name] today has no headache,no chest abdominal pain,no new weakness tingling or numbness, feels much better wants to go home today.     Objective   Blood pressure 118/77, pulse 60, temperature 98 F (36.7 C), temperature source Oral, resp. rate 17, height 5\' 8"  (1.727 m), weight 76.7 kg (169 lb), SpO2 94 %.   Intake/Output Summary (Last 24 hours) at 07/28/15 1339 Last data filed at 07/28/15 0900  Gross per 24 hour  Intake              510 ml  Output                0 ml  Net              510 ml    Exam Awake Alert, Oriented x 3, No new F.N deficits, Normal affect [place].AT,PERRAL Supple Neck,No JVD, No cervical lymphadenopathy appriciated.  Symmetrical Chest wall movement, Good air movement bilaterally, CTAB RRR,No Gallops,Rubs or new Murmurs, No Parasternal Heave +ve B.Sounds, Abd Soft, Non tender, No organomegaly appriciated, No rebound -guarding or rigidity. LLQ drain in place No Cyanosis, Clubbing or edema, No new Rash or bruise   Data Review   CBC w Diff:  Lab Results  Component Value Date   WBC 7.3 07/26/2015   HGB 14.6 07/26/2015   HCT 42.6 07/26/2015   PLT 288 07/26/2015    CMP:  Lab Results  Component Value Date   NA 136 07/26/2015   K 4.4 07/27/2015   CL 103 07/26/2015   CO2 26 07/26/2015   BUN 8 07/26/2015   CREATININE 0.71 07/26/2015   CREATININE 1.07 07/22/2015   PROT 7.0 07/22/2015   ALBUMIN 4.4 07/22/2015   BILITOT 1.3 (H) 07/22/2015   ALKPHOS 46 07/22/2015   AST 17 07/22/2015   ALT 23 07/22/2015  .   Total Time in preparing paper work, data evaluation and todays exam - 35 minutes  Leroy Sea M.D on 07/28/2015 at 1:39 PM  [facility]   Office  [phone]

## 2015-07-29 LAB — AEROBIC/ANAEROBIC CULTURE (SURGICAL/DEEP WOUND)

## 2015-07-29 LAB — AEROBIC/ANAEROBIC CULTURE W GRAM STAIN (SURGICAL/DEEP WOUND)

## 2015-07-31 ENCOUNTER — Ambulatory Visit (INDEPENDENT_AMBULATORY_CARE_PROVIDER_SITE_OTHER): Payer: BLUE CROSS/BLUE SHIELD | Admitting: Physician Assistant

## 2015-07-31 ENCOUNTER — Ambulatory Visit (INDEPENDENT_AMBULATORY_CARE_PROVIDER_SITE_OTHER): Payer: BLUE CROSS/BLUE SHIELD

## 2015-07-31 VITALS — BP 108/62 | HR 70 | Temp 98.4°F | Resp 16 | Ht 68.0 in | Wt 164.0 lb

## 2015-07-31 DIAGNOSIS — Z09 Encounter for follow-up examination after completed treatment for conditions other than malignant neoplasm: Secondary | ICD-10-CM

## 2015-07-31 LAB — POCT CBC
GRANULOCYTE PERCENT: 70.7 % (ref 37–80)
HEMATOCRIT: 48.8 % (ref 43.5–53.7)
Hemoglobin: 16.6 g/dL (ref 14.1–18.1)
Lymph, poc: 2 (ref 0.6–3.4)
MCH: 30.6 pg (ref 27–31.2)
MCHC: 34.1 g/dL (ref 31.8–35.4)
MCV: 89.5 fL (ref 80–97)
MID (cbc): 0.3 (ref 0–0.9)
MPV: 7.2 fL (ref 0–99.8)
PLATELET COUNT, POC: 401 10*3/uL (ref 142–424)
POC GRANULOCYTE: 5.6 (ref 2–6.9)
POC LYMPH PERCENT: 25.7 %L (ref 10–50)
POC MID %: 3.6 %M (ref 0–12)
RBC: 5.45 M/uL (ref 4.69–6.13)
RDW, POC: 13.7 %
WBC: 7.9 10*3/uL (ref 4.6–10.2)

## 2015-07-31 NOTE — Progress Notes (Signed)
07/31/2015 6:43 PM   DOB: 10/30/1955 / MRN: [id number]  SUBJECTIVE:  Marcus [name] is a 60 y.o. male presenting for hospital follow up.  I saw him on 7/18 and CT scan revealed a perforated diverticula.  He feels overall well today but does complain of feeling tired.  He complains of a mild cough.  He is taking Cipro and culture shows this to be effective.   He has No Known Allergies.   He  has a past medical history of Diverticulitis.    He  reports that he has never smoked. He does not have any smokeless tobacco history on file. He reports that he does not drink alcohol or use drugs. He  has no sexual activity history on file. The patient  has a past surgical history that includes Colonoscopy.  His family history includes Depression in his brother; Diverticulitis in his mother; Hypertension in his brother and father; Urolithiasis in his father.  Review of Systems  Constitutional: Negative for chills and fever.  Respiratory: Negative for cough.   Cardiovascular: Negative for chest pain.  Gastrointestinal: Negative for nausea.  Genitourinary: Negative for dysuria.  Musculoskeletal: Negative for myalgias.  Skin: Negative for rash.  Neurological: Negative for dizziness and headaches.  Psychiatric/Behavioral: Negative for depression.    Problem list and medications reviewed and updated by myself where necessary, and exist elsewhere in the encounter.   OBJECTIVE:  BP 108/62 (BP Location: Right Arm, Patient Position: Sitting, Cuff Size: Normal)   Pulse 70   Temp 98.4 F (36.9 C)   Resp 16   Ht  (1.727 m)   Wt 164 lb (74.4 kg)   SpO2 96%   BMI 24.94 kg/m   Physical Exam  Constitutional: He is oriented to person, place, and time. He appears well-developed and well-nourished. He does not appear ill. No distress.  Eyes: Conjunctivae and EOM are normal. Pupils are equal, round, and reactive to light.  Cardiovascular: Normal rate.   Pulmonary/Chest: Effort normal.    Abdominal: He exhibits no distension.    Musculoskeletal: Normal range of motion.  Neurological: He is alert and oriented to person, place, and time. No cranial nerve deficit. Coordination normal.  Skin: Skin is warm and dry. He is not diaphoretic.  Psychiatric: He has a normal mood and affect.  Nursing note and vitals reviewed.   Results for orders placed or performed in visit on 07/31/15 (from the past 72 hour(s))  POCT CBC     Status: None   Collection Time: 07/31/15  6:42 PM  Result Value Ref Range   WBC 7.9 4.6 - 10.2 K/uL   Lymph, poc 2.0 0.6 - 3.4   POC LYMPH PERCENT 25.7 10 - 50 %L   MID (cbc) 0.3 0 - 0.9   POC MID % 3.6 0 - 12 %M   POC Granulocyte 5.6 2 - 6.9   Granulocyte percent 70.7 37 - 80 %G   RBC 5.45 4.69 - 6.13 M/uL   Hemoglobin 16.6 14.1 - 18.1 g/dL   HCT, POC 11.9 14.7 - 53.7 %   MCV 89.5 80 - 97 fL   MCH, POC 30.6 27 - 31.2 pg   MCHC 34.1 31.8 - 35.4 g/dL   RDW, POC 82.9 %   Platelet Count, POC 401 142 - 424 K/uL   MPV 7.2 0 - 99.8 fL    Dg Chest 2 View  Result Date: 07/31/2015 CLINICAL DATA:  Follow-up.  Mild cough.  History of diverticulitis. EXAM: CHEST  2 VIEW COMPARISON:  None. FINDINGS: Cardiomediastinal silhouette is normal in size and configuration. Lungs are clear. Lung volumes are normal. No evidence of pneumonia. No pleural effusion. No pneumothorax. Osseous and soft tissue structures about the chest are unremarkable. Mild degenerative spurring within the lower thoracic spine. IMPRESSION: No active cardiopulmonary disease. Electronically Signed   By: Bary Taveon M.D.   On: 07/31/2015 18:26   ASSESSMENT AND PLAN  Malaky was seen today for follow-up.  Diagnoses and all orders for this visit:  Hospital discharge follow-up: PE and work up negative thus far.  CMP pending.  -     POCT CBC -     COMPLETE METABOLIC PANEL WITH GFR -     DG Chest 2 View; Future    The patient was advised to call or return to clinic if he does not see an  improvement in symptoms, or to seek the care of the closest emergency department if he worsens with the above plan.   Deliah Boston, MHS, PA-C [facility] 07/31/2015 6:43 PM

## 2015-07-31 NOTE — Patient Instructions (Signed)
     IF you received an x-ray today, you will receive an invoice from [facility]. Please contact [facility] at [phone] with questions or concerns regarding your invoice.   IF you received labwork today, you will receive an invoice from [facility]. Please contact [facility] at [phone] with questions or concerns regarding your invoice.   Our billing staff will not be able to assist you with questions regarding bills from these companies.  You will be contacted with the lab results as soon as they are available. The fastest way to get your results is to activate your My Chart account. Instructions are located on the last page of this paperwork. If you have not heard from us regarding the results in 2 weeks, please contact this office.      

## 2015-08-01 LAB — COMPLETE METABOLIC PANEL WITH GFR
ALT: 49 U/L — AB (ref 9–46)
AST: 24 U/L (ref 10–35)
Albumin: 4 g/dL (ref 3.6–5.1)
Alkaline Phosphatase: 47 U/L (ref 40–115)
BILIRUBIN TOTAL: 0.4 mg/dL (ref 0.2–1.2)
BUN: 12 mg/dL (ref 7–25)
CALCIUM: 9.6 mg/dL (ref 8.6–10.3)
CHLORIDE: 101 mmol/L (ref 98–110)
CO2: 28 mmol/L (ref 20–31)
CREATININE: 0.91 mg/dL (ref 0.70–1.33)
GFR, Est African American: >89 mL/min (ref >=60)
GFR, Est Non African American: >89 mL/min (ref >=60)
Glucose, Bld: 92 mg/dL (ref 65–99)
Potassium: 4.4 mmol/L (ref 3.5–5.3)
Sodium: 138 mmol/L (ref 135–146)
TOTAL PROTEIN: 7.2 g/dL (ref 6.1–8.1)

## 2015-08-04 ENCOUNTER — Encounter: Payer: Self-pay | Admitting: *Deleted

## 2015-08-06 ENCOUNTER — Ambulatory Visit
Admission: RE | Admit: 2015-08-06 | Discharge: 2015-08-06 | Disposition: A | Discharge status: Home / Self Care | Payer: BLUE CROSS/BLUE SHIELD | Source: Ambulatory Visit | Attending: General Surgery | Admitting: General Surgery

## 2015-08-06 ENCOUNTER — Ambulatory Visit
Admission: RE | Admit: 2015-08-06 | Discharge: 2015-08-06 | Disposition: A | Discharge status: Home / Self Care | Payer: BLUE CROSS/BLUE SHIELD | Source: Ambulatory Visit | Attending: Radiology | Admitting: Radiology

## 2015-08-06 DIAGNOSIS — K5720 Diverticulitis of large intestine with perforation and abscess without bleeding: Secondary | ICD-10-CM

## 2015-08-06 HISTORY — PX: IR GENERIC HISTORICAL: IMG1180011

## 2015-08-06 MED ORDER — IOPAMIDOL (ISOVUE-300) INJECTION 61%
100.0000 mL | Freq: Once | INTRAVENOUS | Status: AC | PRN
  Administered 2015-08-06: 100 mL via INTRAVENOUS

## 2015-08-06 NOTE — Progress Notes (Signed)
Referring Physician(s): Dr. Johna Sheriff  Supervising Physician: Jolaine Click  Patient Status:  Outpatient  Chief Complaint: Abscess drain follow up  Subjective: Marcus [name] is a 60yo male with diverticulitis and LLQ abscess. He underwent perc drain placement on 7/20. He returns today for follow up CT and drain injection. He is feeling well. Has been flushing his drain as directed, but does meet some resistance after only about 3 cc of flush. He is also not getting much output. Denies fever, chills, abd pain. Has not yet seen his surgeon in follow up.  Allergies: Review of patient's allergies indicates no known allergies.  Medications: Prior to Admission medications   Medication Sig Start Date End Date Taking? Authorizing Provider  acetaminophen (TYLENOL) 500 MG tablet Take 500 mg by mouth every 6 (six) hours as needed for moderate pain.    Historical Provider, MD  ciprofloxacin (CIPRO) 500 MG tablet Take 1 tablet (500 mg total) by mouth 2 (two) times daily. 07/28/15   Leroy Sea, MD  ibuprofen (ADVIL,MOTRIN) 200 MG tablet Take 400 mg by mouth every 6 (six) hours as needed for moderate pain.    Historical Provider, MD  ondansetron (ZOFRAN) 4 MG tablet Take 1 tablet (4 mg total) by mouth every 8 (eight) hours as needed for nausea or vomiting. Patient not taking: Reported on 07/31/2015 07/28/15   Leroy Sea, MD  oxyCODONE-acetaminophen (PERCOCET/ROXICET) 5-325 MG tablet Take 2 tablets by mouth every 8 (eight) hours as needed for severe pain. 07/28/15   Leroy Sea, MD  saccharomyces boulardii (FLORASTOR) 250 MG capsule Take 1 capsule (250 mg total) by mouth 2 (two) times daily. 07/28/15   Leroy Sea, MD     Vital Signs: BP 106/67 (BP Location: Right Arm)   Pulse 69   Temp 98.5 F (36.9 C)   SpO2 98%   Physical Exam Abd: soft, ND, NT LLQ drain intact, site clean Scant output  Imaging: Ct Abdomen Pelvis W Contrast  Result Date: 08/06/2015 CLINICAL  DATA:  Followup abscess drain EXAM: CT ABDOMEN AND PELVIS WITH CONTRAST TECHNIQUE: Multidetector CT imaging of the abdomen and pelvis was performed using the standard protocol following bolus administration of intravenous contrast. CONTRAST:  ISOVUE-300 IOPAMIDOL (ISOVUE-300) INJECTION 61% COMPARISON:  07/22/2015 FINDINGS: Apical drain has been placed in the left pelvic abscess. The abscess cavity is completely decompressed. Wall thickening and inflammatory change of the sigmoid colon have improved. Stranding in the adjacent mesenteric fat persists. Wall thickening of the adjacent bladder persists. No extraluminal bowel gas. New new abscess cavity. Subsegmental atelectasis at the lung bases Mild diffuse hepatic steatosis. Indistinct right lobe liver lesion is difficult to visualize on today's study. Spleen, adrenal glands, kidneys are within normal limits. Stable tiny low-density lesion in the head of the pancreas Normal appendix. IMPRESSION: After pigtail drain placement, the abscess cavity has resolved. Drain injection is to follow. Inflammatory changes of the adjacent sigmoid colon have improved. Exam is otherwise stable. Electronically Signed   By: Jolaine Click M.D.   On: 08/06/2015 13:06    Labs:  CBC:  Recent Labs  07/23/15 0129 07/24/15 0522 07/26/15 0546 07/31/15 1842  WBC 15.0* 11.7* 7.3 7.9  HGB 14.4 13.3 14.6 16.6  HCT 41.9 38.7* 42.6 48.8  PLT 213 215 288  --     COAGS:  Recent Labs  07/22/15 2217  INR 1.26  APTT 29    BMP:  Recent Labs  07/22/15 1936 07/23/15 0129 07/26/15 0546 07/27/15 0542  07/31/15 1809  NA 135 136 136  --  138  K 3.8 3.7 3.4* 4.4 4.4  CL 101 106 103  --  101  CO2 23 25 26   --  28  GLUCOSE 98 96 86  --  92  BUN 10 9 8   --  12  CALCIUM 9.2 8.1* 8.5*  --  9.6  CREATININE 0.95 0.90 0.71  --  0.91  GFRNONAA >60 >60 >60  --  >89  GFRAA >60 >60 >60  --  >89    LIVER FUNCTION TESTS:  Recent Labs  07/22/15 1451 07/31/15 1809    BILITOT 1.3* 0.4  AST 17 24  ALT 23 49*  ALKPHOS 46 47  PROT 7.0 7.2  ALBUMIN 4.4 4.0    Assessment and Plan: LLQ abscess s/p perc drain 7/20 CT reviewed, essential resolution of abscess Drain injection performed shows no evidence of fistula or residual abscess cavity. Drain removed today in clinic, no complications. He will still follow up with Dr. Johna Sheriff as planned.  Electronically Signed: Brayton El 08/06/2015, 1:42 PM   I spent a total of 25 Minutes at the the patient's bedside AND on the patient's hospital floor or unit, greater than 50% of which was counseling/coordinating care for LLQ diverticular abscess

## 2016-01-08 ENCOUNTER — Encounter: Payer: Self-pay | Admitting: Radiology

## 2016-12-15 ENCOUNTER — Encounter: Payer: Self-pay | Admitting: Physician Assistant

## 2016-12-15 ENCOUNTER — Ambulatory Visit (INDEPENDENT_AMBULATORY_CARE_PROVIDER_SITE_OTHER): Payer: 59

## 2016-12-15 ENCOUNTER — Ambulatory Visit: Payer: 59 | Admitting: Physician Assistant

## 2016-12-15 VITALS — BP 142/79 | HR 70 | Temp 98.3°F | Resp 16 | Ht 68.0 in | Wt 169.0 lb

## 2016-12-15 DIAGNOSIS — Z8719 Personal history of other diseases of the digestive system: Secondary | ICD-10-CM

## 2016-12-15 DIAGNOSIS — R1032 Left lower quadrant pain: Secondary | ICD-10-CM

## 2016-12-15 DIAGNOSIS — K5900 Constipation, unspecified: Secondary | ICD-10-CM | POA: Diagnosis not present

## 2016-12-15 DIAGNOSIS — R300 Dysuria: Secondary | ICD-10-CM | POA: Diagnosis not present

## 2016-12-15 LAB — POCT CBC
Granulocyte percent: 66.2 %G (ref 37–80)
HEMATOCRIT: 48 % (ref 43.5–53.7)
Hemoglobin: 16 g/dL (ref 14.1–18.1)
LYMPH, POC: 2 (ref 0.6–3.4)
MCH, POC: 30 pg (ref 27–31.2)
MCHC: 33.3 g/dL (ref 31.8–35.4)
MCV: 90 fL (ref 80–97)
MID (CBC): 0.1 (ref 0–0.9)
MPV: 7.4 fL (ref 0–99.8)
POC Granulocyte: 4.3 (ref 2–6.9)
POC LYMPH PERCENT: 31.5 %L (ref 10–50)
POC MID %: 2.3 %M (ref 0–12)
Platelet Count, POC: 295 10*3/uL (ref 142–424)
RBC: 5.34 M/uL (ref 4.69–6.13)
RDW, POC: 12.9 %
WBC: 6.5 10*3/uL (ref 4.6–10.2)

## 2016-12-15 LAB — POCT URINALYSIS DIP (MANUAL ENTRY)
Bilirubin, UA: NEGATIVE
Blood, UA: NEGATIVE
Glucose, UA: NEGATIVE mg/dL
Ketones, POC UA: NEGATIVE mg/dL
LEUKOCYTES UA: NEGATIVE
NITRITE UA: NEGATIVE
PH UA: 6 (ref 5.0–8.0)
PROTEIN UA: NEGATIVE mg/dL
Spec Grav, UA: 1.015 (ref 1.010–1.025)
UROBILINOGEN UA: 0.2 U/dL

## 2016-12-15 LAB — POC MICROSCOPIC URINALYSIS (UMFC): Mucus: ABSENT

## 2016-12-15 MED ORDER — POLYETHYLENE GLYCOL 3350 17 GM/SCOOP PO POWD: 17.0000 g | Freq: Two times a day (BID) | ORAL | 1 refills | Status: DC | PRN

## 2016-12-15 MED ORDER — AMOXICILLIN-POT CLAVULANATE 875-125 MG PO TABS: 1.0000 | ORAL_TABLET | Freq: Two times a day (BID) | ORAL | 0 refills | Status: AC

## 2016-12-15 NOTE — Progress Notes (Deleted)
[facility] [address], [place] [id number] [phone]  Date:  12/15/2016   Name:  Marcus MarsRichard Rondeau   DOB:  04-Jun-1955   MRN:  [id number]  PCP:  Ofilia Neaslark, Michael L, PA-C    History of Present Illness:  Marcus MarsRichard Litzinger is a 61 y.o. male patient who presents to PCP with  Chief Complaint  Patient presents with  . Flank Pain    left side x1 week  . Dysuria    pressure right before urinating; pressure goes away after voiding       Patient Active Problem List   Diagnosis Date Noted  . Mass of pancreas 07/22/2015  . Perforated sigmoid colon (HCC) 07/22/2015  . Diverticulitis     Past Medical History:  Diagnosis Date  . Diverticulitis     Past Surgical History:  Procedure Laterality Date  . COLONOSCOPY    . IR GENERIC HISTORICAL  08/06/2015   IR RADIOLOGIST EVAL & MGMT 08/06/2015 Marcus ElKevin Bruning, PA-C [facility] INTERV RAD    Social History   Tobacco Use  . Smoking status: Never Smoker  . Smokeless tobacco: Never Used  Substance Use Topics  . Alcohol use: No    Alcohol/week: 0.0 oz  . Drug use: No    Family History  Problem Relation Age of Onset  . Diverticulitis Mother   . Urolithiasis Father   . Hypertension Father   . Hypertension Brother   . Depression Brother     No Known Allergies  Medication list has been reviewed and updated.  Current Outpatient Medications on File Prior to Visit  Medication Sig Dispense Refill  . acetaminophen (TYLENOL) 500 MG tablet Take 500 mg by mouth every 6 (six) hours as needed for moderate pain.    Marland Kitchen. ibuprofen (ADVIL,MOTRIN) 200 MG tablet Take 400 mg by mouth every 6 (six) hours as needed for moderate pain.     No current facility-administered medications on file prior to visit.     ROS ROS otherwise unremarkable unless listed above.  Physical Examination: BP (!) 142/79   Pulse 70   Temp 98.3 F (36.8 C) (Oral)   Resp 16   Ht 5\' 8"  (1.727 m)   Wt 169 lb (76.7 kg)   SpO2 95%   BMI 25.70 kg/m   Ideal Body Weight: Weight in (lb) to have BMI = 25: 164.1  Physical Exam   Results for orders placed or performed in visit on 12/15/16  POCT urinalysis dipstick  Result Value Ref Range   Color, UA yellow yellow   Clarity, UA clear clear   Glucose, UA negative negative mg/dL   Bilirubin, UA negative negative   Ketones, POC UA negative negative mg/dL   Spec Grav, UA 8.6571.015 8.4691.010 - 1.025   Blood, UA negative negative   pH, UA 6.0 5.0 - 8.0   Protein Ur, POC negative negative mg/dL   Urobilinogen, UA 0.2 0.2 or 1.0 E.U./dL   Nitrite, UA Negative Negative   Leukocytes, UA Negative Negative  POCT Microscopic Urinalysis ([facility])  Result Value Ref Range   WBC,UR,HPF,POC None None WBC/hpf   RBC,UR,HPF,POC None None RBC/hpf   Bacteria Many (A) None, Too numerous to count   Mucus Absent Absent   Epithelial Cells, UR Per Microscopy Moderate (A) None, Too numerous to count cells/hpf     Assessment and Plan: Marcus [name] is a 61 y.o. male who is here today  1. Dysuria *** - POCT urinalysis dipstick - POCT Microscopic Urinalysis ([facility]) - Urine  Culture   Trena PlattStephanie English, PA-C [facility] 12/15/2016 5:35 PM

## 2016-12-15 NOTE — Patient Instructions (Addendum)
I would like you to use the miralax for the next 5 days.  You can switch it to once per day, or not at all afterward. Please review and do the soft diet below, after you do one day of liquids. Please take antibiotic as prescribed.  Soft-Food Meal Plan A soft-food meal plan includes foods that are safe and easy to swallow. This meal plan typically is used:  If you are having trouble chewing or swallowing foods.  As a transition meal plan after only having had liquid meals for a long period.  What do I need to know about the soft-food meal plan? A soft-food meal plan includes tender foods that are soft and easy to chew and swallow. In most cases, bite-sized pieces of food are easier to swallow. A bite-sized piece is about  inch or smaller. Foods in this plan do not need to be ground or pureed. Foods that are very hard, crunchy, or sticky should be avoided. Also, breads, cereals, yogurts, and desserts with nuts, seeds, or fruits should be avoided. What foods can I eat? Grains Rice and wild rice. Moist bread, dressing, pasta, and noodles. Well-moistened dry or cooked cereals, such as farina (cooked wheat cereal), oatmeal, or grits. Biscuits, breads, muffins, pancakes, and waffles that have been well moistened. Vegetables Shredded lettuce. Cooked, tender vegetables, including potatoes without skins. Vegetable juices. Broths or creamed soups made with vegetables that are not stringy or chewy. Strained tomatoes (without seeds). Fruits Canned or well-cooked fruits. Soft (ripe), peeled fresh fruits, such as peaches, nectarines, kiwi, cantaloupe, honeydew melon, and watermelon (without seeds). Soft berries with small seeds, such as strawberries. Fruit juices (without pulp). Meats and Other Protein Sources Moist, tender, lean beef. Mutton. Lamb. Veal. Chicken. [place]key. Liver. Ham. Fish without bones. Eggs. Dairy Milk, milk drinks, and cream. Plain cream cheese and cottage cheese. Plain  yogurt. Sweets/Desserts Flavored gelatin desserts. Custard. Plain ice cream, frozen yogurt, sherbet, milk shakes, and malts. Plain cakes and cookies. Plain hard candy. Other Butter, margarine (without trans fat), and cooking oils. Mayonnaise. Cream sauces. Mild spices, salt, and sugar. Syrup, molasses, honey, and jelly. The items listed above may not be a complete list of recommended foods or beverages. Contact your dietitian for more options. What foods are not recommended? Grains Dry bread, toast, crackers that have not been moistened. Coarse or dry cereals, such as bran, granola, and shredded wheat. Tough or chewy crusty breads, such as [place] bread or baguettes. Vegetables Corn. Raw vegetables except shredded lettuce. Cooked vegetables that are tough or stringy. Tough, crisp, fried potatoes and potato skins. Fruits Fresh fruits with skins or seeds or both, such as apples, pears, or grapes. Stringy, high-pulp fruits, such as papaya, pineapple, coconut, or mango. Fruit leather, fruit roll-ups, and all dried fruits. Meats and Other Protein Sources Sausages and hot dogs. Meats with gristle. Fish with bones. Nuts, seeds, and chunky peanut or other nut butters. Sweets/Desserts Cakes or cookies that are very dry or chewy. The items listed above may not be a complete list of foods and beverages to avoid. Contact your dietitian for more information. This information is not intended to replace advice given to you by your health care provider. Make sure you discuss any questions you have with your health care provider. Document Released: 03/30/2007 Document Revised: 05/29/2015 Document Reviewed: 11/17/2012 Elsevier Interactive Patient Education  2017 Elsevier Inc.  Diverticulitis Diverticulitis is when small pockets in your large intestine (colon) get infected or swollen. This causes stomach pain and watery  poop (diarrhea). These pouches are called diverticula. They form in people who have a  condition called diverticulosis. Follow these instructions at home: Medicines  Take over-the-counter and prescription medicines only as told by your doctor. These include: ? Antibiotics. ? Pain medicines. ? Fiber pills. ? Probiotics. ? Stool softeners.  Do not drive or use heavy machinery while taking prescription pain medicine.  If you were prescribed an antibiotic, take it as told. Do not stop taking it even if you feel better. General instructions  Follow a diet as told by your doctor.  When you feel better, your doctor may tell you to change your diet. You may need to eat a lot of fiber. Fiber makes it easier to poop (have bowel movements). Healthy foods with fiber include: ? Berries. ? Beans. ? Lentils. ? Green vegetables.  Exercise 3 or more times a week. Aim for 30 minutes each time. Exercise enough to sweat and make your heart beat faster.  Keep all follow-up visits as told. This is important. You may need to have an exam of the large intestine. This is called a colonoscopy. Contact a doctor if:  Your pain does not get better.  You have a hard time eating or drinking.  You are not pooping like normal. Get help right away if:  Your pain gets worse.  Your problems do not get better.  Your problems get worse very fast.  You have a fever.  You throw up (vomit) more than one time.  You have poop that is: ? Bloody. ? Black. ? Tarry. Summary  Diverticulitis is when small pockets in your large intestine (colon) get infected or swollen.  Take medicines only as told by your doctor.  Follow a diet as told by your doctor. This information is not intended to replace advice given to you by your health care provider. Make sure you discuss any questions you have with your health care provider. Document Released: 06/09/2007 Document Revised: 01/08/2016 Document Reviewed: 01/08/2016 Elsevier Interactive Patient Education  2017 [employer]   IF you received an  x-ray today, you will receive an invoice from [facility]. Please contact [facility] at [phone] with questions or concerns regarding your invoice.   IF you received labwork today, you will receive an invoice from [facility]. Please contact [facility] at [phone] with questions or concerns regarding your invoice.   Our billing staff will not be able to assist you with questions regarding bills from these companies.  You will be contacted with the lab results as soon as they are available. The fastest way to get your results is to activate your My Chart account. Instructions are located on the last page of this paperwork. If you have not heard from us regarding the results in 2 weeks, please contact this office.

## 2016-12-15 NOTE — Progress Notes (Signed)
PRIMARY CARE AT [facility] [address] [place] [address] [phone]  Date:  12/15/2016   Name:  Marcus [name]   DOB:  01/25/1955   MRN:  [id number]  PCP:  Ofilia Neas, PA-C    History of Present Illness:  Marcus [name] is a 61 y.o. male patient with PMH of perforated diverticulitis who presents to PCP with  Chief Complaint  Patient presents with  . Flank Pain    left side x1 week  . Dysuria    pressure right before urinating; pressure goes away after voiding    Left lower abdominal pain for about 10 days.  He has nausea.  No fever.  He has noticed some lethargy.   He has pain before urination but the pressure seems to go away with voiding.  However there is no dysuria during.    He takes mineral oil which helped with the bowel movement.  Intermittent.  Generally has bowel movements tid, in the last week had less than one per day.  No bloody or black stool.  No hematuria or frequency.    It is much better today, as he just ate yogurt and mashed potatoes last night, and half muffin.    He initially thought it was gas, and relieved with bowel movements.    Patient Active Problem List   Diagnosis Date Noted  . Mass of pancreas 07/22/2015  . Perforated sigmoid colon (HCC) 07/22/2015  . Diverticulitis     Past Medical History:  Diagnosis Date  . Diverticulitis     Past Surgical History:  Procedure Laterality Date  . COLONOSCOPY    . IR GENERIC HISTORICAL  08/06/2015   IR RADIOLOGIST EVAL & MGMT 08/06/2015 Marcus El, PA-C [facility] INTERV RAD    Social History   Tobacco Use  . Smoking status: Never Smoker  . Smokeless tobacco: Never Used  Substance Use Topics  . Alcohol use: No    Alcohol/week: 0.0 oz  . Drug use: No    Family History  Problem Relation Age of Onset  . Diverticulitis Mother   . Urolithiasis Father   . Hypertension Father   . Hypertension Brother   . Depression Brother     No Known Allergies  Medication list has been  reviewed and updated.  Current Outpatient Medications on File Prior to Visit  Medication Sig Dispense Refill  . acetaminophen (TYLENOL) 500 MG tablet Take 500 mg by mouth every 6 (six) hours as needed for moderate pain.    Marland Kitchen ibuprofen (ADVIL,MOTRIN) 200 MG tablet Take 400 mg by mouth every 6 (six) hours as needed for moderate pain.     No current facility-administered medications on file prior to visit.     ROS ROS otherwise unremarkable unless listed above.  Physical Examination: BP (!) 142/79   Pulse 70   Temp 98.3 F (36.8 C) (Oral)   Resp 16   Ht 5\' 8"  (1.727 m)   Wt 169 lb (76.7 kg)   SpO2 95%   BMI 25.70 kg/m  Ideal Body Weight: Weight in (lb) to have BMI = 25: 164.1  Physical Exam  Constitutional: He is oriented to person, place, and time. He appears well-developed and well-nourished. No distress.  HENT:  Head: Normocephalic and atraumatic.  Eyes: Conjunctivae and EOM are normal. Pupils are equal, round, and reactive to light.  Cardiovascular: Normal rate.  Pulmonary/Chest: Effort normal. No respiratory distress.  Abdominal: Soft. Normal appearance and bowel sounds are normal. There is no hepatosplenomegaly. There  is tenderness in the suprapubic area and left lower quadrant. There is no CVA tenderness, no tenderness at McBurney's point and negative Murphy's sign.  Neurological: He is alert and oriented to person, place, and time.  Skin: Skin is warm and dry. He is not diaphoretic.  Psychiatric: He has a normal mood and affect. His behavior is normal.   Results for orders placed or performed in visit on 12/15/16  Urine Culture  Result Value Ref Range   Urine Culture, Routine Final report    Organism ID, Bacteria No growth   POCT urinalysis dipstick  Result Value Ref Range   Color, UA yellow yellow   Clarity, UA clear clear   Glucose, UA negative negative mg/dL   Bilirubin, UA negative negative   Ketones, POC UA negative negative mg/dL   Spec Grav, UA 1.6101.015  1.010 - 1.025   Blood, UA negative negative   pH, UA 6.0 5.0 - 8.0   Protein Ur, POC negative negative mg/dL   Urobilinogen, UA 0.2 0.2 or 1.0 E.U./dL   Nitrite, UA Negative Negative   Leukocytes, UA Negative Negative  POCT Microscopic Urinalysis ([facility])  Result Value Ref Range   WBC,UR,HPF,POC None None WBC/hpf   RBC,UR,HPF,POC None None RBC/hpf   Bacteria Many (A) None, Too numerous to count   Mucus Absent Absent   Epithelial Cells, UR Per Microscopy Moderate (A) None, Too numerous to count cells/hpf  POCT CBC  Result Value Ref Range   WBC 6.5 4.6 - 10.2 K/uL   Lymph, poc 2.0 0.6 - 3.4   POC LYMPH PERCENT 31.5 10 - 50 %L   MID (cbc) 0.1 0 - 0.9   POC MID % 2.3 0 - 12 %M   POC Granulocyte 4.3 2 - 6.9   Granulocyte percent 66.2 37 - 80 %G   RBC 5.34 4.69 - 6.13 M/uL   Hemoglobin 16.0 14.1 - 18.1 g/dL   HCT, POC 96.048.0 45.443.5 - 53.7 %   MCV 90.0 80 - 97 fL   MCH, POC 30.0 27 - 31.2 pg   MCHC 33.3 31.8 - 35.4 g/dL   RDW, POC 09.812.9 %   Platelet Count, POC 295 142 - 424 K/uL   MPV 7.4 0 - 99.8 fL    Dg Abd 1 View  Result Date: 12/16/2016 CLINICAL DATA:  Abdominal pain EXAM: ABDOMEN - 1 VIEW COMPARISON:  CT 08/06/2015 FINDINGS: The bowel gas pattern is normal. No radio-opaque calculi or other significant radiographic abnormality are seen. IMPRESSION: Negative. Electronically Signed   By: Charlett NoseKevin  Dover M.D.   On: 12/16/2016 10:33     Assessment and Plan: Marcus MarsRichard [name] is a 61 y.o. male who is here today for cc of  Chief Complaint  Patient presents with  . Flank Pain    left side x1 week  . Dysuria    pressure right before urinating; pressure goes away after voiding  patient is very reluctant to have extensive imaging.   There is still a possibility of diverticulitis.  I am starting him on augmentin at this time.  I will also treat him with miralax.  He will start a clear liquid diet for the next 24 hours, then start soft diet following.  Advised to return in 4 days for  recheck.  Advised of alarming symptoms to warrant immediate return or transport to the ED.  He voiced understanding.   Left lower quadrant pain - Plan: DG Abd 1 View, amoxicillin-clavulanate (AUGMENTIN) 875-125 MG tablet, polyethylene glycol powder (  GLYCOLAX/MIRALAX) powder  Dysuria - Plan: POCT urinalysis dipstick, POCT Microscopic Urinalysis ([facility]), Urine Culture  History of diverticulitis - Plan: POCT CBC, amoxicillin-clavulanate (AUGMENTIN) 875-125 MG tablet, polyethylene glycol powder (GLYCOLAX/MIRALAX) powder  Constipation, unspecified constipation type - Plan: polyethylene glycol powder (GLYCOLAX/MIRALAX) powder  Trena PlattStephanie English, PA-C [facility] 12/15/20188:36 PM

## 2016-12-16 LAB — URINE CULTURE: Organism ID, Bacteria: NO GROWTH

## 2016-12-20 ENCOUNTER — Encounter: Payer: Self-pay | Admitting: Physician Assistant

## 2016-12-20 ENCOUNTER — Ambulatory Visit: Payer: 59 | Admitting: Physician Assistant

## 2016-12-20 ENCOUNTER — Other Ambulatory Visit: Payer: Self-pay

## 2016-12-20 VITALS — BP 106/72 | HR 74 | Resp 16 | Ht 68.0 in | Wt 167.8 lb

## 2016-12-20 DIAGNOSIS — Z23 Encounter for immunization: Secondary | ICD-10-CM

## 2016-12-20 DIAGNOSIS — R1032 Left lower quadrant pain: Secondary | ICD-10-CM | POA: Diagnosis not present

## 2016-12-20 MED ORDER — PSYLLIUM 28 % PO PACK: 1.0000 | PACK | Freq: Every day | ORAL | 3 refills | Status: AC

## 2016-12-20 NOTE — Patient Instructions (Addendum)
Finish the antibiotic.  Stop the miralax and start the fiber regimen.      IF you received an x-ray today, you will receive an invoice from [facility]. Please contact [facility] at [phone] with questions or concerns regarding your invoice.   IF you received labwork today, you will receive an invoice from [facility]. Please contact [facility] at [phone] with questions or concerns regarding your invoice.   Our billing staff will not be able to assist you with questions regarding bills from these companies.  You will be contacted with the lab results as soon as they are available. The fastest way to get your results is to activate your My Chart account. Instructions are located on the last page of this paperwork. If you have not heard from us regarding the results in 2 weeks, please contact this office.

## 2016-12-20 NOTE — Progress Notes (Signed)
    12/20/2016 4:57 PM   DOB: 1955/05/23 / MRN: [id number]  SUBJECTIVE:  Marcus [name] is a 61 y.o. male presenting for follow up of abdominal pain.  He has a history of perforated diverticulitis. Ms. Marcus [name] prescribed him amox-clav after he refused to go a CT scan and advised that he take miralax.  He has been following her instructions and feels that the abdominal pain has resolved.   He has No Known Allergies.   He  has a past medical history of Diverticulitis.    He  reports that  has never smoked. he has never used smokeless tobacco. He reports that he does not drink alcohol or use drugs. He  has no sexual activity history on file. The patient  has a past surgical history that includes Colonoscopy and ir generic historical (08/06/2015).  His family history includes Depression in his brother; Diverticulitis in his mother; Hypertension in his brother and father; Urolithiasis in his father.  Review of Systems  Constitutional: Negative for chills and fever.  Gastrointestinal: Negative for abdominal pain, blood in stool, constipation, diarrhea, heartburn, melena, nausea and vomiting.    The problem list and medications were reviewed and updated by myself where necessary and exist elsewhere in the encounter.   OBJECTIVE:  BP 106/72 (BP Location: Right Arm, Patient Position: Sitting, Cuff Size: Normal)   Pulse 74   Resp 16   Ht 5\' 8"  (1.727 m)   Wt 167 lb 12.8 oz (76.1 kg)   SpO2 95%   BMI 25.51 kg/m   Physical Exam  Constitutional: He is active and cooperative.  Cardiovascular: Normal rate, regular rhythm, S1 normal, S2 normal, normal heart sounds, intact distal pulses and normal pulses. Exam reveals no gallop and no friction rub.  No murmur heard. Pulmonary/Chest: Effort normal. No stridor. No tachypnea. No respiratory distress. He has no wheezes. He has no rales.  Abdominal: Soft. Bowel sounds are normal. He exhibits no distension and no mass. There is no tenderness. There  is no rebound and no guarding.  Musculoskeletal: He exhibits no edema.  Neurological: He is alert.  Skin: Skin is warm.  Vitals reviewed.   No results found for this or any previous visit (from the past 72 hour(s)).  No results found.  ASSESSMENT AND PLAN:  Marcus [name] was seen today for abdominal issue.  Diagnoses and all orders for this visit:  LLQ pain: Given his history he needs to finish the abx.  Will start fiber therapy.  Advised that he need to stay hydrated and avoid constipation.   -     psyllium (METAMUCIL SMOOTH TEXTURE) 28 % packet; Take 1 packet by mouth daily.  Needs flu shot -     Flu Vaccine QUAD 36+ mos IM    The patient is advised to call or return to clinic if he does not see an improvement in symptoms, or to seek the care of the closest emergency department if he worsens with the above plan.   Marcus [name], MHS, PA-C Primary Care at [facility] 12/20/2016 4:57 PM

## 2017-02-23 ENCOUNTER — Ambulatory Visit: Payer: 59 | Admitting: Physician Assistant

## 2017-02-23 ENCOUNTER — Other Ambulatory Visit: Payer: Self-pay

## 2017-02-23 ENCOUNTER — Encounter: Payer: Self-pay | Admitting: Physician Assistant

## 2017-02-23 VITALS — BP 130/92 | HR 72 | Temp 98.1°F | Resp 12 | Ht 67.32 in | Wt 166.2 lb

## 2017-02-23 DIAGNOSIS — L299 Pruritus, unspecified: Secondary | ICD-10-CM

## 2017-02-23 DIAGNOSIS — B889 Infestation, unspecified: Secondary | ICD-10-CM

## 2017-02-23 MED ORDER — METHYLPREDNISOLONE ACETATE 80 MG/ML IJ SUSP
80.0000 mg | Freq: Once | INTRAMUSCULAR | Status: AC
  Administered 2017-02-23: 80 mg via INTRAMUSCULAR

## 2017-02-23 NOTE — Patient Instructions (Addendum)
Go to the drugstore purchase permethrin cream that is formulated for the body.  These are all over-the-counter.  Speak with the pharmacist about this if you need guidance.  You should place it from the neck down and leave it on for 8 hours.  I would recommend washing your sheets and all your close during this time.  Given the severity of your rash I would repeat the lotion the next day.  He can also take Zyrtec 10 mg daily as well as Zantac 150 twice a day.  This will help with itching.    IF you received an x-ray today, you will receive an invoice from [facility]. Please contact [facility] at [phone] with questions or concerns regarding your invoice.   IF you received labwork today, you will receive an invoice from [facility]. Please contact [facility] at [phone] with questions or concerns regarding your invoice.   Our billing staff will not be able to assist you with questions regarding bills from these companies.  You will be contacted with the lab results as soon as they are available. The fastest way to get your results is to activate your My Chart account. Instructions are located on the last page of this paperwork. If you have not heard from us regarding the results in 2 weeks, please contact this office.

## 2017-02-23 NOTE — Progress Notes (Signed)
    02/23/2017 11:10 AM   DOB: 14-Sep-1955 / MRN: [id number]  SUBJECTIVE:  Marcus [name] is a 62 y.o. male presenting for pruritic rash that started about 2 weeks ago after staying in a hotel.  States that since that time he developed a rash on the right flank later on his legs that his torso that his arms.  He saw his dermatologist who felt it was fungal.  He has been taking Lamisil for the last 14 days.  The rash precedes the Lamisil.  He feels well otherwise.  He also has a new pet in the home and apparently the dog had a similar rash which went away with some cream.  He has No Known Allergies.   He  has a past medical history of Diverticulitis.    He  reports that  has never smoked. he has never used smokeless tobacco. He reports that he does not drink alcohol or use drugs. He  has no sexual activity history on file. The patient  has a past surgical history that includes Colonoscopy and ir generic historical (08/06/2015).  His family history includes Depression in his brother; Diverticulitis in his mother; Hypertension in his brother and father; Urolithiasis in his father.  Review of Systems  Skin: Positive for itching and rash.    The problem list and medications were reviewed and updated by myself where necessary and exist elsewhere in the encounter.   OBJECTIVE:  BP (!) 130/92   Pulse 72   Temp 98.1 F (36.7 C)   Resp 12   Ht 5' 7.32" (1.71 m)   Wt 166 lb 3.2 oz (75.4 kg)   SpO2 95%   BMI 25.78 kg/m   Physical Exam      No results found for this or any previous visit (from the past 72 hour(s)).  No results found.  ASSESSMENT AND PLAN:  [name] was seen today for rash.  Diagnoses and all orders for this visit:  Pruritic dermatitis -     methylPREDNISolone acetate (DEPO-MEDROL) injection 80 mg  Mite infestation: Advised permethrin.  He can take Zyrtec as well as Zantac for itching.  He is taking Lamisil and this is new for him however the rash preceded  Lamisil.  I think this will eliminates fungus as a possible diagnosis.  He will come back on Monday if he is not getting better.    The patient is advised to call or return to clinic if he does not see an improvement in symptoms, or to seek the care of the closest emergency department if he worsens with the above plan.   Deliah BostonMichael Chloe Miyoshi, MHS, PA-C Primary Care at [facility] 02/23/2017 11:10 AM

## 2017-02-25 ENCOUNTER — Telehealth: Payer: Self-pay | Admitting: Physician Assistant

## 2017-02-25 NOTE — Telephone Encounter (Signed)
Copied from CRM [phone]. Topic: Quick Communication - Rx Refill/Question >> Feb 25, 2017  2:26 PM Alexander BergeronBarksdale, Harvey B wrote: Medication: Promethean Cream  Pt states that he asked pharmacist for help to look for the medicine OTC that Dr. Chestine Sporelark told him to ask for but the pharmacist was telling him about this cream that is used for lice, and pt is needing further direction on which exact cream he is needing, contact pt to advise

## 2017-02-28 NOTE — Telephone Encounter (Signed)
Relation to pt: self Call back number: [phone]  Pharmacy: [facility] [place] - [address] [phone] (Phone) [phone] (Fax)     Reason for call:  Patient checking on the status of message below, please advise

## 2017-02-28 NOTE — Telephone Encounter (Signed)
Pt. Called to report he tried the OTC as instructed. Did not help. States the pharmacist told him he may need the prescription strength permethrin. Please advise pt. If medication is called in, send to [facility].

## 2017-03-01 NOTE — Telephone Encounter (Signed)
Pt calling in again very disappointed he has been calling since Friday and has not received a response. Pt is requesting a call back asap [phone]. He said he was advised by the Mr. Chestine SporeClark to call if the OTC did not work but he isn't getting a response.   Called [facility] and spoke to Tamarackina, she will notify clinical. Will have to wait for Mr. Chestine SporeClark to fill RX. Pt was notified but still disappointed.

## 2017-03-02 ENCOUNTER — Telehealth: Payer: Self-pay | Admitting: *Deleted

## 2017-03-02 ENCOUNTER — Other Ambulatory Visit: Payer: Self-pay | Admitting: Physician Assistant

## 2017-03-02 MED ORDER — IVERMECTIN 3 MG PO TABS: 200.0000 ug/kg | ORAL_TABLET | Freq: Once | ORAL | 0 refills | Status: AC

## 2017-03-02 MED ORDER — PREDNISONE 20 MG PO TABS: ORAL_TABLET | ORAL | 0 refills | Status: AC

## 2017-03-02 NOTE — Telephone Encounter (Signed)
Starting pred taper and stromecal. Please tell him I am sorry for the delay.  Meds waiting.

## 2017-03-02 NOTE — Telephone Encounter (Signed)
Called patient informed prescription have been called in .  Patient very appreciative.

## 2017-03-31 IMAGING — RF DG SINUS / FISTULA TRACT / ABSCESSOGRAM
2 series · 2 of 2 positions shown · non-contrast
Comparison: CT 08/06/2015, 07/24/2015

CLINICAL DATA: 59-year-old male with a history of diverticular
abscess.

The patient has had prior percutaneous drainage performed.
EXAM:
ABSCESS INJECTION

[Series 1: run · 1 of 1 slices shown (1 of 2)]
[im 1/1]
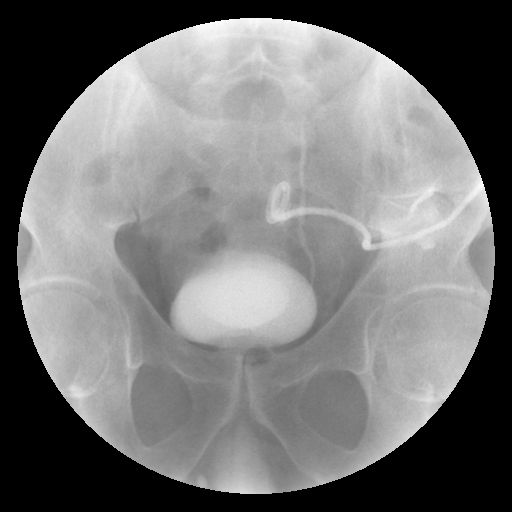

[Series 2: run · 1 of 1 slices shown (2 of 2)]
[im 1/1]
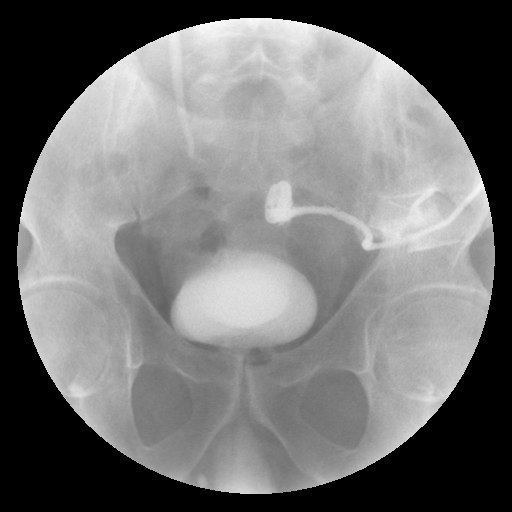

[2 of 2 positions shown; findings below may reference images not displayed]

FINDINGS: Injection of drain in the left lower quadrant demonstrates no
significant residual abscess cavity. No evidence of fistula.
IMPRESSION: This drain injection demonstrates no significant residual abscess
cavity or fistula.

Drain removed at the bedside.

## 2017-03-31 IMAGING — CT CT ABD-PELV W/ CM
2 of 4 series · 10 of 36 positions shown, 17 images · IV contrast ([ID] ISOVUE 300)
Comparison: 07/22/2015

CLINICAL DATA: Followup abscess drain

EXAM:
CT ABDOMEN AND PELVIS WITH CONTRAST
TECHNIQUE: Multidetector CT imaging of the abdomen and pelvis was performed
using the standard protocol following bolus administration of
intravenous contrast.
CONTRAST:  100mL S4EEIS-VII IOPAMIDOL (S4EEIS-VII) INJECTION 61%

[Series 3: abd/pelvis with · axial · 0.74mm/px · z∈[-344,+6]mm · 9 of 87 slices shown, 15 images]
[im 9/87  soft-tissue]
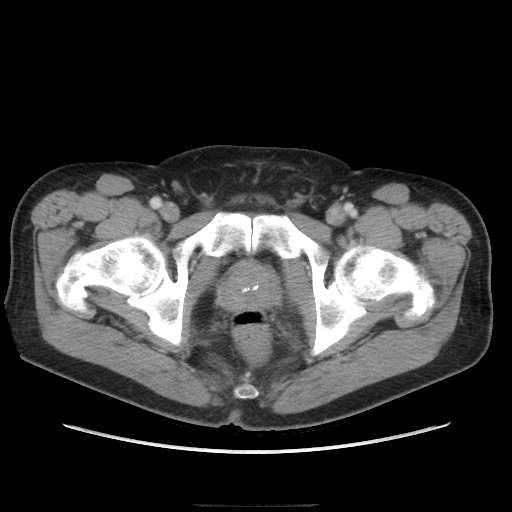
[im 9/87  bone]
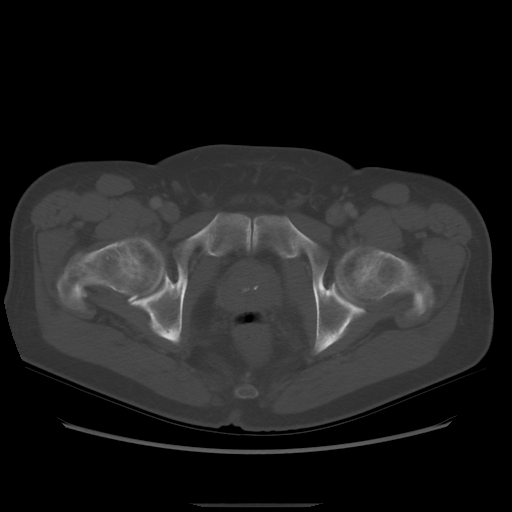
[im 18/87  soft-tissue]
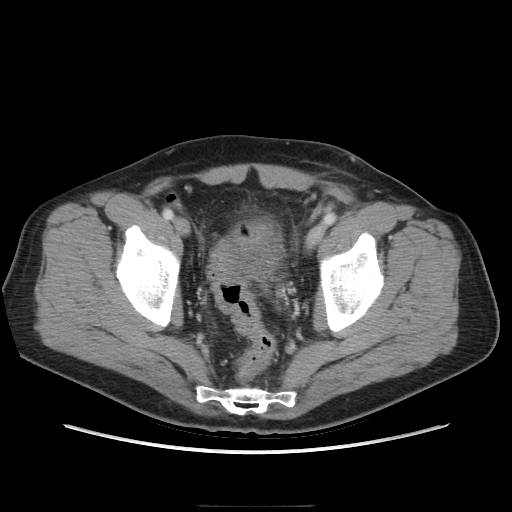
[im 26/87  soft-tissue]
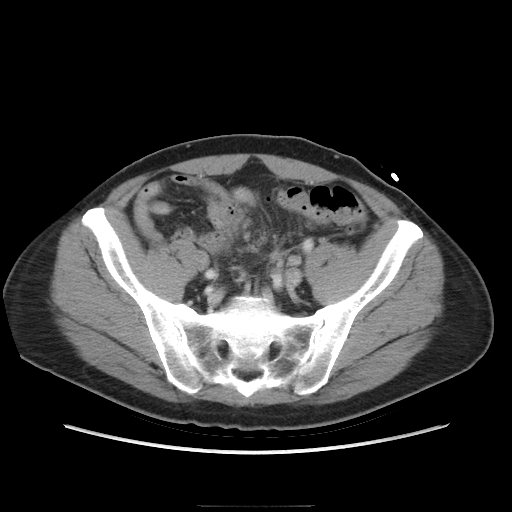
[im 35/87  soft-tissue]
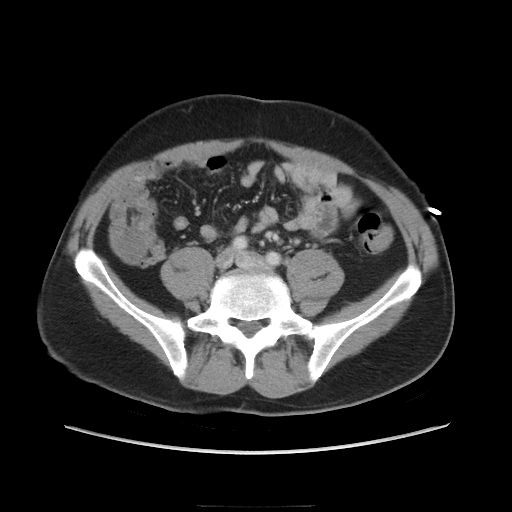
[im 44/87  soft-tissue]
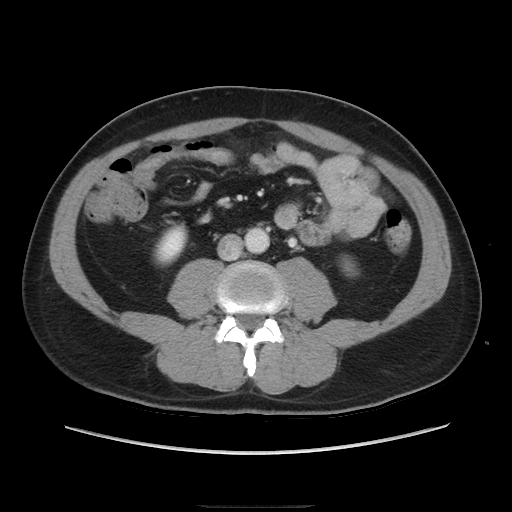
[im 52/87  soft-tissue]
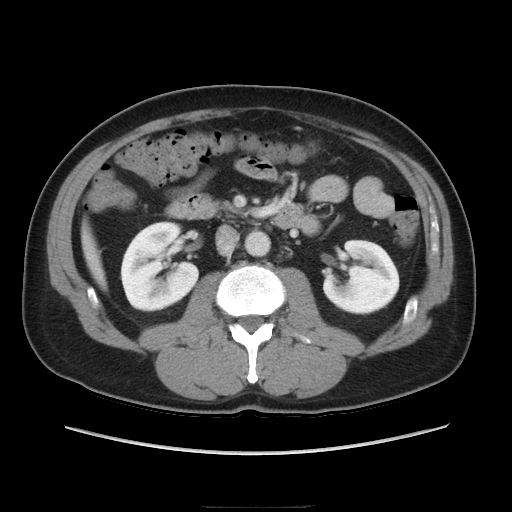
[im 52/87  lung]
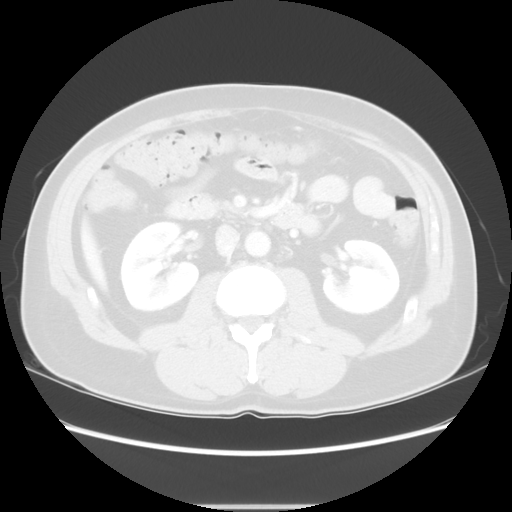
[im 61/87  soft-tissue]
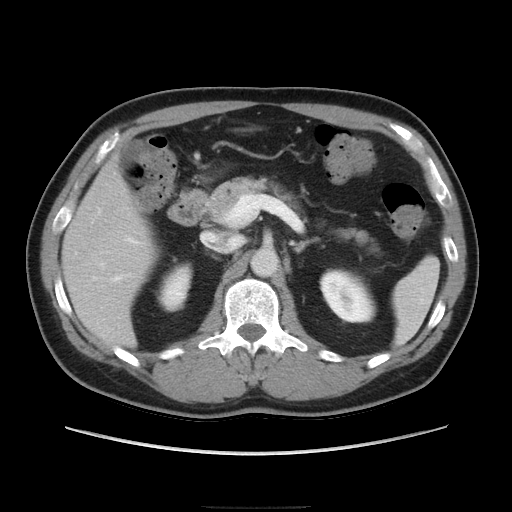
[im 61/87  lung]
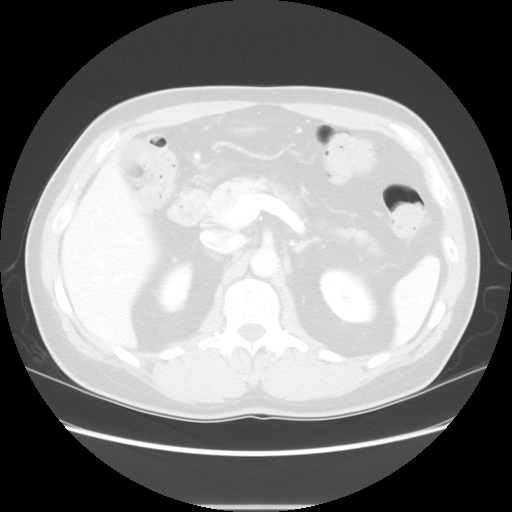
[im 69/87  soft-tissue]
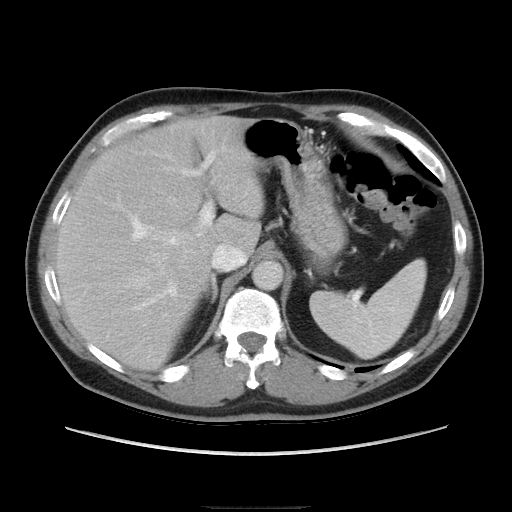
[im 69/87  lung]
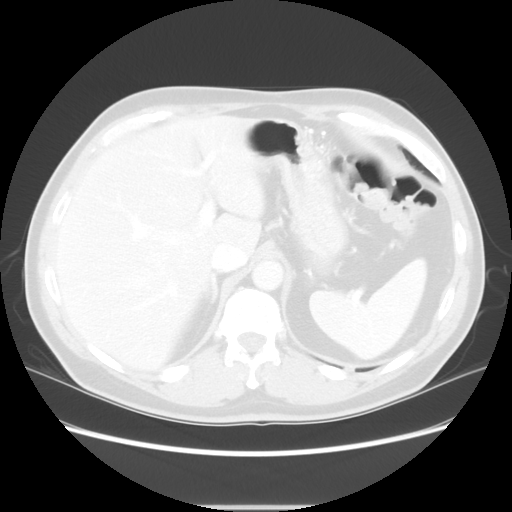
[im 78/87  soft-tissue]
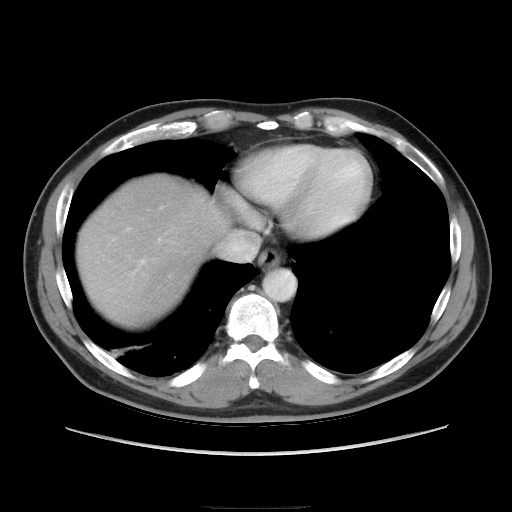
[im 78/87  lung]
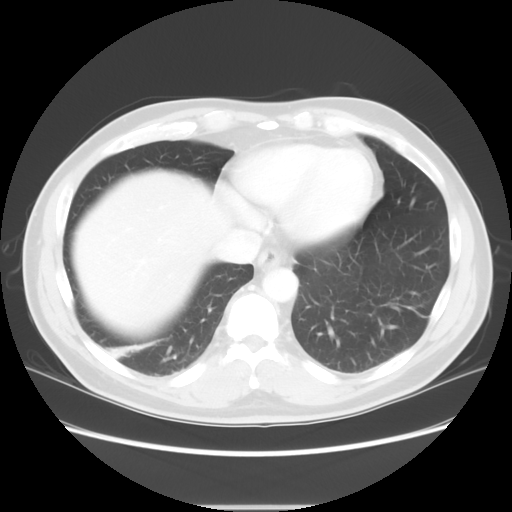
[im 78/87  bone]
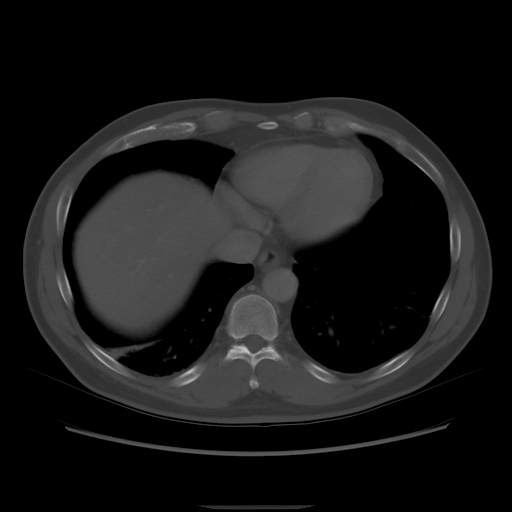

[Series 601: coronal body · coronal · 0.93mm/px · 1 of 107 slices shown, 2 images]
[im 36/107  soft-tissue]
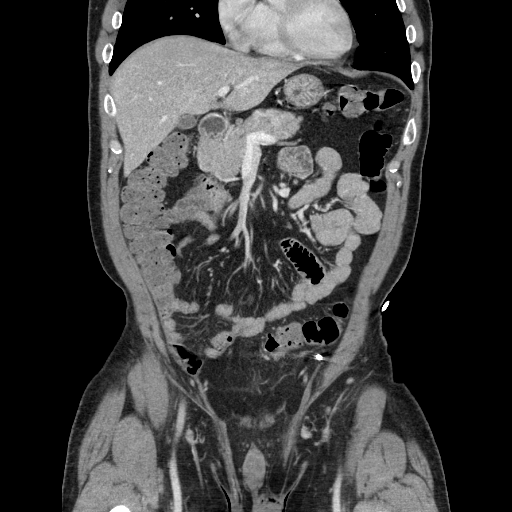
[im 36/107  bone]
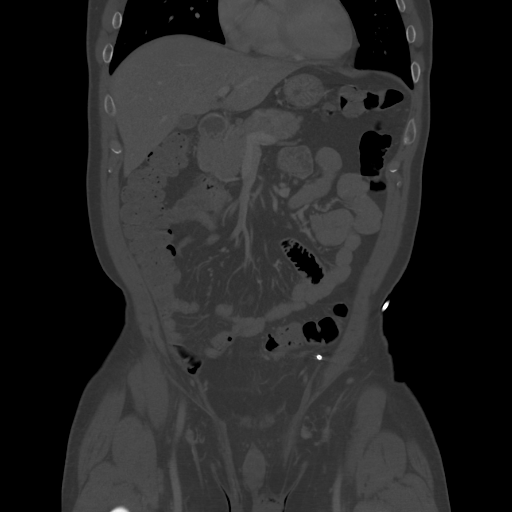

[10 of 36 positions shown; findings below may reference images not displayed]

FINDINGS: Apical drain has been placed in the left pelvic abscess. The abscess
cavity is completely decompressed. Wall thickening and inflammatory
change of the sigmoid colon have improved. Stranding in the adjacent
mesenteric fat persists. Wall thickening of the adjacent bladder
persists. No extraluminal bowel gas. New new abscess cavity.

Subsegmental atelectasis at the lung bases

Mild diffuse hepatic steatosis. Indistinct right lobe liver lesion
is difficult to visualize on today's study.

Spleen, adrenal glands, kidneys are within normal limits.

Stable tiny low-density lesion in the head of the pancreas

Normal appendix.
IMPRESSION: After pigtail drain placement, the abscess cavity has resolved.
Drain injection is to follow.

Inflammatory changes of the adjacent sigmoid colon have improved.
Exam is otherwise stable.

## 2017-04-13 ENCOUNTER — Encounter: Payer: Self-pay | Admitting: Physician Assistant

## 2019-01-17 ENCOUNTER — Telehealth: Payer: Self-pay | Admitting: General Practice

## 2019-01-17 NOTE — Telephone Encounter (Signed)
Pt called in regard to a med refill for ivermectin 3 mg tabs. Pt has not been seen in a year. Last pcp was [facility]. Advised pt to make toc appt

## 2019-01-24 ENCOUNTER — Ambulatory Visit: Payer: BC Managed Care – PPO | Admitting: Family Medicine

## 2019-01-24 ENCOUNTER — Encounter: Payer: Self-pay | Admitting: Family Medicine

## 2019-01-24 ENCOUNTER — Other Ambulatory Visit: Payer: Self-pay

## 2019-01-24 VITALS — BP 130/82 | HR 75 | Temp 98.3°F | Ht 67.0 in | Wt 169.0 lb

## 2019-01-24 DIAGNOSIS — Z23 Encounter for immunization: Secondary | ICD-10-CM

## 2019-01-24 DIAGNOSIS — L309 Dermatitis, unspecified: Secondary | ICD-10-CM

## 2019-01-24 DIAGNOSIS — L299 Pruritus, unspecified: Secondary | ICD-10-CM | POA: Diagnosis not present

## 2019-01-24 DIAGNOSIS — R21 Rash and other nonspecific skin eruption: Secondary | ICD-10-CM | POA: Diagnosis not present

## 2019-01-24 MED ORDER — PREDNISONE 20 MG PO TABS: ORAL_TABLET | ORAL | 0 refills | Status: AC

## 2019-01-24 MED ORDER — TRIAMCINOLONE ACETONIDE 0.1 % EX CREA: 1.0000 "application " | TOPICAL_CREAM | Freq: Two times a day (BID) | CUTANEOUS | 2 refills | Status: DC

## 2019-01-24 NOTE — Patient Instructions (Addendum)
Cetaphil or Eucerin lotion to all dry areas twice per day at least, 3 times per day if needed.  Especially after bathing. Drink at least 64 ounces of water daily. Consider a humidifier for the room where you sleep. Bathe once daily. Avoid using HOT water, as it dries skin.  Avoid deodorant soaps (Dial is the worst!) and stick with gentle cleansers (I like Cetaphil Liquid Cleanser). After bathing, dry off completely, then apply a thick emollient cream (I like Cetaphil Moisturizing Cream). Apply the cream twice daily, or more!  Prednisone started today as that appears to help rash years ago. Less likely scabies or mites based on the timing of your symptoms but we can keep that in the differential to discuss at follow-up.  Prednisone should help your rash and itching.Molli Knock to use steroid cream twice per day to affected areas as well with follow-up in 2 weeks.  Sooner if any worsening symptoms.  Depending on symptoms at follow-up can refer you to dermatology if needed.     Eczema Eczema is a broad term for a group of skin conditions that cause skin to become rough and inflamed. Each type of eczema has different triggers, symptoms, and treatments. Eczema of any type is usually itchy and symptoms range from mild to severe. Eczema and its symptoms are not spread from person to person (are not contagious). It can appear on different parts of the body at different times. Your eczema may not look the same as someone else's eczema. What are the types of eczema? Atopic dermatitis This is a long-term (chronic) skin disease that keeps coming back (recurring). Usual symptoms are dry skin and small, solid pimples that may swell and leak fluid (weep). Contact dermatitis  This happens when something irritates the skin and causes a rash. The irritation can come from substances that you are allergic to (allergens), such as poison ivy, chemicals, or medicines that were applied to your skin. Dyshidrotic  eczema This is a form of eczema on the hands and feet. It shows up as very itchy, fluid-filled blisters. It can affect people of any age, but is more common before age 77. Hand eczema  This causes very itchy areas of skin on the palms and sides of the hands and fingers. This type of eczema is common in industrial jobs where you may be exposed to many different types of irritants. Lichen simplex chronicus This type of eczema occurs when a person constantly scratches one area of the body. Repeated scratching of the area leads to thickened skin (lichenification). Lichen simplex chronicus can occur along with other types of eczema. It is more common in adults, but may be seen in children as well. Nummular eczema This is a common type of eczema. It has no known cause. It typically causes a red, circular, crusty lesion (plaque) that may be itchy. Scratching may become a habit and can cause bleeding. Nummular eczema occurs most often in people of middle-age or older. It most often affects the hands. Seborrheic dermatitis This is a common skin disease that mainly affects the scalp. It may also affect any oily areas of the body, such as the face, sides of nose, eyebrows, ears, eyelids, and chest. It is marked by small scaling and redness of the skin (erythema). This can affect people of all ages. In infants, this condition is known as Location manager." Stasis dermatitis This is a common skin disease that usually appears on the legs and feet. It most often occurs in  people who have a condition that prevents blood from being pumped through the veins in the legs (chronic venous insufficiency). Stasis dermatitis is a chronic condition that needs long-term management. How is eczema diagnosed? Your health care provider will examine your skin and review your medical history. He or she may also give you skin patch tests. These tests involve taking patches that contain possible allergens and placing them on your back. He or  she will then check in a few days to see if an allergic reaction occurred. What are the common treatments? Treatment for eczema is based on the type of eczema you have. Hydrocortisone steroid medicine can relieve itching quickly and help reduce inflammation. This medicine may be prescribed or obtained over-the-counter, depending on the strength of the medicine that is needed. Follow these instructions at home:  Take over-the-counter and prescription medicines only as told by your health care provider.  Use creams or ointments to moisturize your skin. Do not use lotions.  Learn what triggers or irritates your symptoms. Avoid these things.  Treat symptom flare-ups quickly.  Do not itch your skin. This can make your rash worse.  Keep all follow-up visits as told by your health care provider. This is important. Where to find more information  The American Academy of Dermatology: InfoExam.si  The National Eczema Association: www.nationaleczema.org Contact a health care provider if:  You have serious itching, even with treatment.  You regularly scratch your skin until it bleeds.  Your rash looks different than usual.  Your skin is painful, swollen, or more red than usual.  You have a fever. Summary  There are eight general types of eczema. Each type has different triggers.  Eczema of any type causes itching that may range from mild to severe.  Treatment varies based on the type of eczema you have. Hydrocortisone steroid medicine can help with itching and inflammation.  Protecting your skin is the best way to prevent eczema. Use moisturizers and lotions. Avoid triggers and irritants, and treat flare-ups quickly. This information is not intended to replace advice given to you by your health care provider. Make sure you discuss any questions you have with your health care provider. Document Revised: 12/03/2016 Document Reviewed: 05/06/2016 Elsevier Patient Education  2020 Elsevier  Inc.   Rash, Adult A rash is a change in the color of your skin. A rash can also change the way your skin feels. There are many different conditions and factors that can cause a rash. Some rashes may disappear after a few days, but some may last for a few weeks. Common causes of rashes include:  Viral infections, such as: ? Colds. ? Measles. ? Hand, foot, and mouth disease.  Bacterial infections, such as: ? Scarlet fever. ? Impetigo.  Fungal infections, such as Candida.  Allergic reactions to food, medicines, or skin care products. Follow these instructions at home: The goal of treatment is to stop the itching and keep the rash from spreading. Pay attention to any changes in your symptoms. Follow these instructions to help with your condition: Medicine Take or apply over-the-counter and prescription medicines only as told by your health care provider. These may include:  Corticosteroid creams to treat red or swollen skin.  Anti-itch lotions.  Oral allergy medicines (antihistamines).  Oral corticosteroids for severe symptoms.  Skin care  Apply cool compresses to the affected areas.  Do not scratch or rub your skin.  Avoid covering the rash. Make sure the rash is exposed to air as much  as possible. Managing itching and discomfort  Avoid hot showers or baths, which can make itching worse. A cold shower may help.  Try taking a bath with: ? Epsom salts. Follow manufacturer instructions on the packaging. You can get these at your local pharmacy or grocery store. ? Baking soda. Pour a small amount into the bath as told by your health care provider. ? Colloidal oatmeal. Follow manufacturer instructions on the packaging. You can get this at your local pharmacy or grocery store.  Try applying baking soda paste to your skin. Stir water into baking soda until it reaches a paste-like consistency.  Try applying calamine lotion. This is an over-the-counter lotion that helps to  relieve itchiness.  Keep cool and out of the sun. Sweating and being hot can make itching worse. General instructions   Rest as needed.  Drink enough fluid to keep your urine pale yellow.  Wear loose-fitting clothing.  Avoid scented soaps, detergents, and perfumes. Use gentle soaps, detergents, perfumes, and other cosmetic products.  Avoid any substance that causes your rash. Keep a journal to help track what causes your rash. Write down: ? What you eat. ? What cosmetic products you use. ? What you drink. ? What you wear. This includes jewelry.  Keep all follow-up visits as told by your health care provider. This is important. Contact a health care provider if:  You sweat at night.  You lose weight.  You urinate more than normal.  You urinate less than normal, or you notice that your urine is a darker color than usual.  You feel weak.  You vomit.  Your skin or the whites of your eyes look yellow (jaundice).  Your skin: ? Tingles. ? Is numb.  Your rash: ? Does not go away after several days. ? Gets worse.  You are: ? Unusually thirsty. ? More tired than normal.  You have: ? New symptoms. ? Pain in your abdomen. ? A fever. ? Diarrhea. Get help right away if you:  Have a fever and your symptoms suddenly get worse.  Develop confusion.  Have a severe headache or a stiff neck.  Have severe joint pains or stiffness.  Have a seizure.  Develop a rash that covers all or most of your body. The rash may or may not be painful.  Develop blisters that: ? Are on top of the rash. ? Grow larger or grow together. ? Are painful. ? Are inside your nose or mouth.  Develop a rash that: ? Looks like purple pinprick-sized spots all over your body. ? Has a "bull's eye" or looks like a target. ? Is not related to sun exposure, is red and painful, and causes your skin to peel. Summary  A rash is a change in the color of your skin. Some rashes disappear after a few  days, but some may last for a few weeks.  The goal of treatment is to stop the itching and keep the rash from spreading.  Take or apply over-the-counter and prescription medicines only as told by your health care provider.  Contact a health care provider if you have new or worsening symptoms.  Keep all follow-up visits as told by your health care provider. This is important. This information is not intended to replace advice given to you by your health care provider. Make sure you discuss any questions you have with your health care provider. Document Revised: 04/14/2018 Document Reviewed: 07/25/2017 Elsevier Patient Education  [place].  If you have lab work done today you will be contacted with your lab results within the next 2 weeks.  If you have not heard from us then please contact us. The fastest way to get your results is to register for My Chart.   IF you received an x-ray today, you will receive an invoice from [facility]. Please contact [facility] at [phone] with questions or concerns regarding your invoice.   IF you received labwork today, you will receive an invoice from [facility]. Please contact [facility] at [phone] with questions or concerns regarding your invoice.   Our billing staff will not be able to assist you with questions regarding bills from these companies.  You will be contacted with the lab results as soon as they are available. The fastest way to get your results is to activate your My Chart account. Instructions are located on the last page of this paperwork. If you have not heard from us regarding the results in 2 weeks, please contact this office.     

## 2019-01-24 NOTE — Progress Notes (Signed)
Subjective:  Patient ID: Marcus [name], male    DOB: Apr 23, 1955  Age: 64 y.o. MRN: [id number]  CC:  Chief Complaint  Patient presents with  . Rash    pt states he is having a rash out brakes that seems similar to a rash the pt had back on 02/23/2017 that the pt saw a Deliah BostonMichael, Clark PA-C. pt would like a refill on the same medication he got back then if the doctor feels it's the same type of rash. pt states it dosen't feel like his typical egzima. the rash is itchy at night.     HPI Marcus MarsRichard Manzi presents for   Rash:  On back, chest, arms. Sometimes on waist. Has eczema on legs. Itching on thighs, sides, buttocks.  Rash for 2-3 years. Worse at night - itches more.  Treated by Deliah BostonMichael Clark in 02/2017 - diagnosed with mite infestation. Rash on arms.  Treated with depomedrol 80mg  IM, permethrin, zyrtec, zantac. Rash no change with lamisil, or those meds.   Rash resolved with prednisone and ivermectin.  Rash returned 6-8 months.  No other family members with same rash. Wife sleep in same bed.  No fever. Wt loss  Seen by derm - treated for eczema, asked about itching all over - recommended Sarna lotion and benadryl at night.  Has been seen by 2 dermatologists for eczema - most recent one - only saw once, last appt 6 months.   [place] in [place]. Steroid cream in past - helps after a week. Has not used recently.  Tx: cortisone cream 2 times per week. cetaphil lotion daily   History Patient Active Problem List   Diagnosis Date Noted  . Mass of pancreas 07/22/2015  . Perforated sigmoid colon (HCC) 07/22/2015  . Diverticulitis    Past Medical History:  Diagnosis Date  . Diverticulitis    Past Surgical History:  Procedure Laterality Date  . COLONOSCOPY    . IR GENERIC HISTORICAL  08/06/2015   IR RADIOLOGIST EVAL & MGMT 08/06/2015 Marcus ElKevin Bruning, PA-C [facility] INTERV RAD   No Known Allergies Prior to Admission medications   Medication Sig Start Date End Date Taking? Authorizing  Provider  triamcinolone cream (KENALOG) 0.1 %  02/03/17  Yes [provider]  acetaminophen (TYLENOL) 500 MG tablet Take 500 mg by mouth every 6 (six) hours as needed for moderate pain.    [provider]  ibuprofen (ADVIL,MOTRIN) 200 MG tablet Take 400 mg by mouth every 6 (six) hours as needed for moderate pain.    [provider]  PHENYLEPHRINE HCL PO Take 10 mg by mouth as needed.    [provider]  psyllium (METAMUCIL SMOOTH TEXTURE) 28 % packet Take 1 packet by mouth daily. Patient not taking: Reported on 01/24/2019 12/20/16   Ofilia Neaslark, Michael L, PA-C  terbinafine (LAMISIL) 250 MG tablet  02/03/17   [provider]   Social History   Socioeconomic History  . Marital status: Married    Spouse name: Not on file  . Number of children: Not on file  . Years of education: Not on file  . Highest education level: Not on file  Occupational History  . Not on file  Tobacco Use  . Smoking status: Never Smoker  . Smokeless tobacco: Never Used  Substance and Sexual Activity  . Alcohol use: No    Alcohol/week: 0.0 standard drinks  . Drug use: No  . Sexual activity: Not on file  Other Topics Concern  . Not on file  Social History Narrative  . Not on file   Social Determinants of Health   Financial Resource Strain:   . Difficulty of Paying Living Expenses: Not on file  Food Insecurity:   . Worried About Programme researcher, broadcasting/film/video in the Last Year: Not on file  . Ran Out of Food in the Last Year: Not on file  Transportation Needs:   . Lack of Transportation (Medical): Not on file  . Lack of Transportation (Non-Medical): Not on file  Physical Activity:   . Days of Exercise per Week: Not on file  . Minutes of Exercise per Session: Not on file  Stress:   . Feeling of Stress : Not on file  Social Connections:   . Frequency of Communication with Friends and Family: Not on file  . Frequency of Social Gatherings with Friends and Family: Not on file  .  Attends Religious Services: Not on file  . Active Member of Clubs or Organizations: Not on file  . Attends Banker Meetings: Not on file  . Marital Status: Not on file  Intimate Partner Violence:   . Fear of Current or Ex-Partner: Not on file  . Emotionally Abused: Not on file  . Physically Abused: Not on file  . Sexually Abused: Not on file    Review of Systems  HENT: Negative for mouth sores.   Genitourinary: Negative for genital sores.  Skin: Positive for rash.     Objective:   Vitals:   01/24/19 1608 01/24/19 1736  BP: (!) 142/84 130/82  Pulse: 75   Temp: 98.3 F (36.8 C)   TempSrc: Temporal   SpO2: 95%   Weight: 169 lb (76.7 kg)   Height: 5\' 7"  (1.702 m)      Physical Exam Vitals reviewed.  Constitutional:      General: He is not in acute distress.    Appearance: He is well-developed.  HENT:     Head: Normocephalic and atraumatic.  Cardiovascular:     Rate and Rhythm: Normal rate.  Pulmonary:     Effort: Pulmonary effort is normal.  Skin:         Comments: Diffuse fine excoriated papules, patches on trunk, larger confluent patch on lower abdominal wall with slight erythematous, scaled appearance.  Also with excoriation.  Left lower leg with confluent patches of excoriation, faint scale.  Sparing of hands and feet, no interdigital lesions.  No vesicles.  Neurological:     Mental Status: He is alert and oriented to person, place, and time.        Assessment & Plan:  Alecxis Baltzell is a 64 y.o. male . Eczema, unspecified type - Plan: predniSONE (DELTASONE) 20 MG tablet, triamcinolone cream (KENALOG) 0.1 % Pruritus - Plan: predniSONE (DELTASONE) 20 MG tablet Rash and nonspecific skin eruption - Plan: predniSONE (DELTASONE) 20 MG tablet   -Previous note reviewed in 2019 with similar rash described at that time.  Did note improvement after second treatment, and I suspect that may have been due to steroid taper as opposed to ivermectin.   Current symptoms and timing less likely infestation as partner at home without any symptoms.  Also spiders fingers, less likely scabies.  Does have significant underlying eczema, may have some diffuse sensitive skin combined with dry skin dermatitis, combined with eczema plus or minus allergic cause.  -Ultimately decided to treat with steroid taper, potential side effects and risks discussed.  Topical treatment with triamcinolone twice per day to affected areas including eczematous areas  along with hydrating lotion such as Eucerin or Cetaphil.  -Dry skin care reviewed  -Recheck in 2 weeks, and if not significantly improved at that time would recommend dermatology evaluation.  Option of another dermatologist opinion if he would prefer. Need for prophylactic vaccination and inoculation against influenza - Plan: Flu Vaccine QUAD 6+ mos PF IM (Fluarix Quad PF)    Meds ordered this encounter  Medications  . predniSONE (DELTASONE) 20 MG tablet    Sig: 3 by mouth for 3 days, then 2 by mouth for 2 days, then 1 by mouth for 2 days, then 1/2 by mouth for 2 days.    Dispense:  16 tablet    Refill:  0  . triamcinolone cream (KENALOG) 0.1 %    Sig: Apply 1 application topically 2 (two) times daily.    Dispense:  80 g    Refill:  2   Patient Instructions   Cetaphil or Eucerin lotion to all dry areas twice per day at least, 3 times per day if needed.  Especially after bathing. Drink at least 64 ounces of water daily. Consider a humidifier for the room where you sleep. Bathe once daily. Avoid using HOT water, as it dries skin.  Avoid deodorant soaps (Dial is the worst!) and stick with gentle cleansers (I like Cetaphil Liquid Cleanser). After bathing, dry off completely, then apply a thick emollient cream (I like Cetaphil Moisturizing Cream). Apply the cream twice daily, or more!  Prednisone started today as that appears to help rash years ago. Less likely scabies or mites based on the timing of your  symptoms but we can keep that in the differential to discuss at follow-up.  Prednisone should help your rash and itching.Molli Knock to use steroid cream twice per day to affected areas as well with follow-up in 2 weeks.  Sooner if any worsening symptoms.  Depending on symptoms at follow-up can refer you to dermatology if needed.     Eczema Eczema is a broad term for a group of skin conditions that cause skin to become rough and inflamed. Each type of eczema has different triggers, symptoms, and treatments. Eczema of any type is usually itchy and symptoms range from mild to severe. Eczema and its symptoms are not spread from person to person (are not contagious). It can appear on different parts of the body at different times. Your eczema may not look the same as someone else's eczema. What are the types of eczema? Atopic dermatitis This is a long-term (chronic) skin disease that keeps coming back (recurring). Usual symptoms are dry skin and small, solid pimples that may swell and leak fluid (weep). Contact dermatitis  This happens when something irritates the skin and causes a rash. The irritation can come from substances that you are allergic to (allergens), such as poison ivy, chemicals, or medicines that were applied to your skin. Dyshidrotic eczema This is a form of eczema on the hands and feet. It shows up as very itchy, fluid-filled blisters. It can affect people of any age, but is more common before age 103. Hand eczema  This causes very itchy areas of skin on the palms and sides of the hands and fingers. This type of eczema is common in industrial jobs where you may be exposed to many different types of irritants. Lichen simplex chronicus This type of eczema occurs when a person constantly scratches one area of the body. Repeated scratching of the area leads to thickened skin (lichenification). Lichen simplex chronicus  can occur along with other types of eczema. It is more common in adults,  but may be seen in children as well. Nummular eczema This is a common type of eczema. It has no known cause. It typically causes a red, circular, crusty lesion (plaque) that may be itchy. Scratching may become a habit and can cause bleeding. Nummular eczema occurs most often in people of middle-age or older. It most often affects the hands. Seborrheic dermatitis This is a common skin disease that mainly affects the scalp. It may also affect any oily areas of the body, such as the face, sides of nose, eyebrows, ears, eyelids, and chest. It is marked by small scaling and redness of the skin (erythema). This can affect people of all ages. In infants, this condition is known as Location manager"cradle cap." Stasis dermatitis This is a common skin disease that usually appears on the legs and feet. It most often occurs in people who have a condition that prevents blood from being pumped through the veins in the legs (chronic venous insufficiency). Stasis dermatitis is a chronic condition that needs long-term management. How is eczema diagnosed? Your health care provider will examine your skin and review your medical history. He or she may also give you skin patch tests. These tests involve taking patches that contain possible allergens and placing them on your back. He or she will then check in a few days to see if an allergic reaction occurred. What are the common treatments? Treatment for eczema is based on the type of eczema you have. Hydrocortisone steroid medicine can relieve itching quickly and help reduce inflammation. This medicine may be prescribed or obtained over-the-counter, depending on the strength of the medicine that is needed. Follow these instructions at home:  Take over-the-counter and prescription medicines only as told by your health care provider.  Use creams or ointments to moisturize your skin. Do not use lotions.  Learn what triggers or irritates your symptoms. Avoid these things.  Treat  symptom flare-ups quickly.  Do not itch your skin. This can make your rash worse.  Keep all follow-up visits as told by your health care provider. This is important. Where to find more information  The American Academy of Dermatology: InfoExam.siwww.aad.org  The National Eczema Association: www.nationaleczema.org Contact a health care provider if:  You have serious itching, even with treatment.  You regularly scratch your skin until it bleeds.  Your rash looks different than usual.  Your skin is painful, swollen, or more red than usual.  You have a fever. Summary  There are eight general types of eczema. Each type has different triggers.  Eczema of any type causes itching that may range from mild to severe.  Treatment varies based on the type of eczema you have. Hydrocortisone steroid medicine can help with itching and inflammation.  Protecting your skin is the best way to prevent eczema. Use moisturizers and lotions. Avoid triggers and irritants, and treat flare-ups quickly. This information is not intended to replace advice given to you by your health care provider. Make sure you discuss any questions you have with your health care provider. Document Revised: 12/03/2016 Document Reviewed: 05/06/2016 Elsevier Patient Education  2020 Elsevier Inc.   Rash, Adult A rash is a change in the color of your skin. A rash can also change the way your skin feels. There are many different conditions and factors that can cause a rash. Some rashes may disappear after a few days, but some may last for a few  weeks. Common causes of rashes include:  Viral infections, such as: ? Colds. ? Measles. ? Hand, foot, and mouth disease.  Bacterial infections, such as: ? Scarlet fever. ? Impetigo.  Fungal infections, such as Candida.  Allergic reactions to food, medicines, or skin care products. Follow these instructions at home: The goal of treatment is to stop the itching and keep the rash from  spreading. Pay attention to any changes in your symptoms. Follow these instructions to help with your condition: Medicine Take or apply over-the-counter and prescription medicines only as told by your health care provider. These may include:  Corticosteroid creams to treat red or swollen skin.  Anti-itch lotions.  Oral allergy medicines (antihistamines).  Oral corticosteroids for severe symptoms.  Skin care  Apply cool compresses to the affected areas.  Do not scratch or rub your skin.  Avoid covering the rash. Make sure the rash is exposed to air as much as possible. Managing itching and discomfort  Avoid hot showers or baths, which can make itching worse. A cold shower may help.  Try taking a bath with: ? Epsom salts. Follow manufacturer instructions on the packaging. You can get these at your local pharmacy or grocery store. ? Baking soda. Pour a small amount into the bath as told by your health care provider. ? Colloidal oatmeal. Follow manufacturer instructions on the packaging. You can get this at your local pharmacy or grocery store.  Try applying baking soda paste to your skin. Stir water into baking soda until it reaches a paste-like consistency.  Try applying calamine lotion. This is an over-the-counter lotion that helps to relieve itchiness.  Keep cool and out of the sun. Sweating and being hot can make itching worse. General instructions   Rest as needed.  Drink enough fluid to keep your urine pale yellow.  Wear loose-fitting clothing.  Avoid scented soaps, detergents, and perfumes. Use gentle soaps, detergents, perfumes, and other cosmetic products.  Avoid any substance that causes your rash. Keep a journal to help track what causes your rash. Write down: ? What you eat. ? What cosmetic products you use. ? What you drink. ? What you wear. This includes jewelry.  Keep all follow-up visits as told by your health care provider. This is important. Contact a  health care provider if:  You sweat at night.  You lose weight.  You urinate more than normal.  You urinate less than normal, or you notice that your urine is a darker color than usual.  You feel weak.  You vomit.  Your skin or the whites of your eyes look yellow (jaundice).  Your skin: ? Tingles. ? Is numb.  Your rash: ? Does not go away after several days. ? Gets worse.  You are: ? Unusually thirsty. ? More tired than normal.  You have: ? New symptoms. ? Pain in your abdomen. ? A fever. ? Diarrhea. Get help right away if you:  Have a fever and your symptoms suddenly get worse.  Develop confusion.  Have a severe headache or a stiff neck.  Have severe joint pains or stiffness.  Have a seizure.  Develop a rash that covers all or most of your body. The rash may or may not be painful.  Develop blisters that: ? Are on top of the rash. ? Grow larger or grow together. ? Are painful. ? Are inside your nose or mouth.  Develop a rash that: ? Looks like purple pinprick-sized spots all over your body. ? Has  a "bull's eye" or looks like a target. ? Is not related to sun exposure, is red and painful, and causes your skin to peel. Summary  A rash is a change in the color of your skin. Some rashes disappear after a few days, but some may last for a few weeks.  The goal of treatment is to stop the itching and keep the rash from spreading.  Take or apply over-the-counter and prescription medicines only as told by your health care provider.  Contact a health care provider if you have new or worsening symptoms.  Keep all follow-up visits as told by your health care provider. This is important. This information is not intended to replace advice given to you by your health care provider. Make sure you discuss any questions you have with your health care provider. Document Revised: 04/14/2018 Document Reviewed: 07/25/2017 Elsevier Patient Education  [employer].      If you have lab work done today you will be contacted with your lab results within the next 2 weeks.  If you have not heard from [place] then please contact us. The fastest way to get your results is to register for My Chart.   IF you received an x-ray today, you will receive an invoice from [facility]. Please contact [facility] at [phone] with questions or concerns regarding your invoice.   IF you received labwork today, you will receive an invoice from [facility]. Please contact [facility] at [phone] with questions or concerns regarding your invoice.   Our billing staff will not be able to assist you with questions regarding bills from these companies.  You will be contacted with the lab results as soon as they are available. The fastest way to get your results is to activate your My Chart account. Instructions are located on the last page of this paperwork. If you have not heard from [place] regarding the results in 2 weeks, please contact this office.         Signed, Meredith Staggers, MD [facility]

## 2019-02-07 ENCOUNTER — Telehealth (INDEPENDENT_AMBULATORY_CARE_PROVIDER_SITE_OTHER): Payer: BC Managed Care – PPO | Admitting: Family Medicine

## 2019-02-07 ENCOUNTER — Other Ambulatory Visit: Payer: Self-pay

## 2019-02-07 ENCOUNTER — Encounter: Payer: Self-pay | Admitting: Family Medicine

## 2019-02-07 VITALS — Ht 67.0 in | Wt 169.0 lb

## 2019-02-07 DIAGNOSIS — R21 Rash and other nonspecific skin eruption: Secondary | ICD-10-CM

## 2019-02-07 DIAGNOSIS — L309 Dermatitis, unspecified: Secondary | ICD-10-CM | POA: Diagnosis not present

## 2019-02-07 MED ORDER — TRIAMCINOLONE ACETONIDE 0.1 % EX CREA: 1.0000 "application " | TOPICAL_CREAM | Freq: Two times a day (BID) | CUTANEOUS | 2 refills | Status: AC

## 2019-02-07 NOTE — Progress Notes (Signed)
Virtual Visit via Video Note  I connected with Marcus [name] on 02/07/19 at 5:59 PM by a video enabled telemedicine application [employer] and verified that I am speaking with the correct person using two identifiers.   I discussed the limitations, risks, security and privacy concerns of performing an evaluation and management service by telephone and the availability of in person appointments. I also discussed with the patient that there may be a patient responsible charge related to this service. The patient expressed understanding and agreed to proceed, consent obtained.   Chief complaint: Chief Complaint  Patient presents with  . Follow-up    on pt Eczema. pt states he itching has goten better sinece the prednizone. pt states now that he has finished the cream pt has noticed a comback in itching. pt would like to stick with the prednizone cream if possible.     History of Present Illness: Marcus [name] is a 65 y.o. male  Eczema; Follow-up of rash.  Diffuse rash, suspected underlying eczema with possible dry skin dermatitis, possible allergic cause.  Treated with steroid taper on January 20, along with topical triamcinolone twice daily to affected areas, and hydrating lotion with Eucerin or Cetaphil. Possible dermatology evaluation was discussed.  Took last prednisone few days ago - felt like improved on prednisone. Only used TAC cream occasionally.  Itching returned to back - thinks may be less cetaphil to back - other areas were doing ok. cetaphil twice per day also seemed to help. Rash and itching on legs has subsided, improved.  Would like to try triamcinolone as needed.  Rash on waist improved, chest cleared.        Patient Active Problem List   Diagnosis Date Noted  . Mass of pancreas 07/22/2015  . Perforated sigmoid colon (HCC) 07/22/2015  . Diverticulitis    Past Medical History:  Diagnosis Date  . Diverticulitis    Past Surgical History:  Procedure Laterality  Date  . COLONOSCOPY    . IR GENERIC HISTORICAL  08/06/2015   IR RADIOLOGIST EVAL & MGMT 08/06/2015 Marcus El, PA-C [facility] INTERV RAD   No Known Allergies Prior to Admission medications   Medication Sig Start Date End Date Taking? Authorizing Provider  acetaminophen (TYLENOL) 500 MG tablet Take 500 mg by mouth every 6 (six) hours as needed for moderate pain.   Yes [provider]  ibuprofen (ADVIL,MOTRIN) 200 MG tablet Take 400 mg by mouth every 6 (six) hours as needed for moderate pain.   Yes [provider]  PHENYLEPHRINE HCL PO Take 10 mg by mouth as needed.   Yes [provider]  predniSONE (DELTASONE) 20 MG tablet 3 by mouth for 3 days, then 2 by mouth for 2 days, then 1 by mouth for 2 days, then 1/2 by mouth for 2 days. 01/24/19  Yes Shade Flood, MD  psyllium (METAMUCIL SMOOTH TEXTURE) 28 % packet Take 1 packet by mouth daily. 12/20/16  Yes Ofilia Neas, PA-C  terbinafine (LAMISIL) 250 MG tablet  02/03/17  Yes [provider]  triamcinolone cream (KENALOG) 0.1 % Apply 1 application topically 2 (two) times daily. 01/24/19  Yes Shade Flood, MD   Social History   Socioeconomic History  . Marital status: Married    Spouse name: Not on file  . Number of children: Not on file  . Years of education: Not on file  . Highest education level: Not on file  Occupational History  . Not on file  Tobacco Use  .  Smoking status: Never Smoker  . Smokeless tobacco: Never Used  Substance and Sexual Activity  . Alcohol use: No    Alcohol/week: 0.0 standard drinks  . Drug use: No  . Sexual activity: Not on file  Other Topics Concern  . Not on file  Social History Narrative  . Not on file   Social Determinants of Health   Financial Resource Strain:   . Difficulty of Paying Living Expenses: Not on file  Food Insecurity:   . Worried About Charity fundraiser in the Last Year: Not on file  . Ran Out of Food in the Last Year: Not on file   Transportation Needs:   . Lack of Transportation (Medical): Not on file  . Lack of Transportation (Non-Medical): Not on file  Physical Activity:   . Days of Exercise per Week: Not on file  . Minutes of Exercise per Session: Not on file  Stress:   . Feeling of Stress : Not on file  Social Connections:   . Frequency of Communication with Friends and Family: Not on file  . Frequency of Social Gatherings with Friends and Family: Not on file  . Attends Religious Services: Not on file  . Active Member of Clubs or Organizations: Not on file  . Attends Archivist Meetings: Not on file  . Marital Status: Not on file  Intimate Partner Violence:   . Fear of Current or Ex-Partner: Not on file  . Emotionally Abused: Not on file  . Physically Abused: Not on file  . Sexually Abused: Not on file    Observations/Objective: Vitals:   02/07/19 1534  Weight: 169 lb (76.7 kg)  Height: 5\' 7"  (1.702 m)     Assessment and Plan: Eczema, unspecified type  Rash and nonspecific skin eruption Improved symptoms.  Suspect combination of dry skin dermatitis, eczema, and possible allergic cause.  Improved with prednisone.  Continue Cetaphil lotion for dry skin, triamcinolone to affected areas as needed with dermatology follow-up if not continuing to improve. rtc precautions.    Follow Up Instructions: Patient Instructions    Glad to hear that the rash has improved.  I still think there is a combination of dry skin dermatitis as well as eczema.  Continue Cetaphil at least once per day, preferably twice per day to any dry areas, triamcinolone if needed up to twice per day for any itching areas and if not continuing to improve would recommend following up with dermatology.  Let me know and I can send in a new referral.  Give me an update the next few weeks by MyChart if possible.  Take care.     If you have lab work done today you will be contacted with your lab results within the next 2 weeks.   If you have not heard from [place] then please contact us. The fastest way to get your results is to register for My Chart.   IF you received an x-ray today, you will receive an invoice from [facility]. Please contact [facility] at [phone] with questions or concerns regarding your invoice.   IF you received labwork today, you will receive an invoice from [facility]. Please contact [facility] at [phone] with questions or concerns regarding your invoice.   Our billing staff will not be able to assist you with questions regarding bills from these companies.  You will be contacted with the lab results as soon as they are available. The fastest way to get your results is to  activate your My Chart account. Instructions are located on the last page of this paperwork. If you have not heard from [place] regarding the results in 2 weeks, please contact this office.         I discussed the assessment and treatment plan with the patient. The patient was provided an opportunity to ask questions and all were answered. The patient agreed with the plan and demonstrated an understanding of the instructions.   The patient was advised to call back or seek an in-person evaluation if the symptoms worsen or if the condition fails to improve as anticipated.  I provided 15 minutes of non-face-to-face time during this encounter.   Shade Flood, MD

## 2019-02-07 NOTE — Patient Instructions (Addendum)
  Glad to hear that the rash has improved.  I still think there is a combination of dry skin dermatitis as well as eczema.  Continue Cetaphil at least once per day, preferably twice per day to any dry areas, triamcinolone if needed up to twice per day for any itching areas and if not continuing to improve would recommend following up with dermatology.  Let me know and I can send in a new referral.  Give me an update the next few weeks by MyChart if possible.  Take care.     If you have lab work done today you will be contacted with your lab results within the next 2 weeks.  If you have not heard from [place] then please contact us. The fastest way to get your results is to register for My Chart.   IF you received an x-ray today, you will receive an invoice from [facility]. Please contact [facility] at [phone] with questions or concerns regarding your invoice.   IF you received labwork today, you will receive an invoice from [place]. Please contact [facility] at [phone] with questions or concerns regarding your invoice.   Our billing staff will not be able to assist you with questions regarding bills from these companies.  You will be contacted with the lab results as soon as they are available. The fastest way to get your results is to activate your My Chart account. Instructions are located on the last page of this paperwork. If you have not heard from [place] regarding the results in 2 weeks, please contact this office.

## 2022-05-28 ENCOUNTER — Ambulatory Visit
Admission: RE | Admit: 2022-05-28 | Discharge: 2022-05-28 | Disposition: A | Discharge status: Home / Self Care | Payer: BLUE CROSS/BLUE SHIELD | Source: Ambulatory Visit | Attending: Family Medicine | Admitting: Family Medicine

## 2022-05-28 ENCOUNTER — Other Ambulatory Visit: Payer: Self-pay | Admitting: Family Medicine

## 2022-05-28 DIAGNOSIS — R0989 Other specified symptoms and signs involving the circulatory and respiratory systems: Secondary | ICD-10-CM

## 2022-12-31 DIAGNOSIS — B078 Other viral warts: Secondary | ICD-10-CM | POA: Diagnosis not present

## 2022-12-31 DIAGNOSIS — L820 Inflamed seborrheic keratosis: Secondary | ICD-10-CM | POA: Diagnosis not present

## 2022-12-31 DIAGNOSIS — B355 Tinea imbricata: Secondary | ICD-10-CM | POA: Diagnosis not present

## 2023-01-18 DIAGNOSIS — J989 Respiratory disorder, unspecified: Secondary | ICD-10-CM | POA: Diagnosis not present

## 2023-01-18 DIAGNOSIS — H66001 Acute suppurative otitis media without spontaneous rupture of ear drum, right ear: Secondary | ICD-10-CM | POA: Diagnosis not present

## 2023-02-10 DIAGNOSIS — H6980 Other specified disorders of Eustachian tube, unspecified ear: Secondary | ICD-10-CM | POA: Diagnosis not present

## 2023-02-10 DIAGNOSIS — J310 Chronic rhinitis: Secondary | ICD-10-CM | POA: Diagnosis not present

## 2023-05-05 DIAGNOSIS — K5790 Diverticulosis of intestine, part unspecified, without perforation or abscess without bleeding: Secondary | ICD-10-CM | POA: Diagnosis not present

## 2023-05-05 DIAGNOSIS — Z125 Encounter for screening for malignant neoplasm of prostate: Secondary | ICD-10-CM | POA: Diagnosis not present

## 2023-05-05 DIAGNOSIS — Z130 Encounter for screening for diseases of the blood and blood-forming organs and certain disorders involving the immune mechanism: Secondary | ICD-10-CM | POA: Diagnosis not present

## 2023-05-05 DIAGNOSIS — Z136 Encounter for screening for cardiovascular disorders: Secondary | ICD-10-CM | POA: Diagnosis not present

## 2023-05-05 DIAGNOSIS — Z13220 Encounter for screening for lipoid disorders: Secondary | ICD-10-CM | POA: Diagnosis not present

## 2023-05-09 ENCOUNTER — Other Ambulatory Visit (HOSPITAL_COMMUNITY): Payer: Self-pay | Admitting: Family Medicine

## 2023-05-09 DIAGNOSIS — Z130 Encounter for screening for diseases of the blood and blood-forming organs and certain disorders involving the immune mechanism: Secondary | ICD-10-CM | POA: Diagnosis not present

## 2023-05-09 DIAGNOSIS — Z0000 Encounter for general adult medical examination without abnormal findings: Secondary | ICD-10-CM | POA: Diagnosis not present

## 2023-05-09 DIAGNOSIS — K5790 Diverticulosis of intestine, part unspecified, without perforation or abscess without bleeding: Secondary | ICD-10-CM | POA: Diagnosis not present

## 2023-05-09 DIAGNOSIS — Z13220 Encounter for screening for lipoid disorders: Secondary | ICD-10-CM | POA: Diagnosis not present

## 2023-05-09 DIAGNOSIS — R972 Elevated prostate specific antigen [PSA]: Secondary | ICD-10-CM | POA: Diagnosis not present

## 2023-05-09 DIAGNOSIS — R21 Rash and other nonspecific skin eruption: Secondary | ICD-10-CM | POA: Diagnosis not present

## 2023-05-09 DIAGNOSIS — Z1211 Encounter for screening for malignant neoplasm of colon: Secondary | ICD-10-CM | POA: Diagnosis not present

## 2023-05-09 DIAGNOSIS — N400 Enlarged prostate without lower urinary tract symptoms: Secondary | ICD-10-CM | POA: Diagnosis not present

## 2023-05-09 DIAGNOSIS — E785 Hyperlipidemia, unspecified: Secondary | ICD-10-CM

## 2023-05-09 DIAGNOSIS — Z125 Encounter for screening for malignant neoplasm of prostate: Secondary | ICD-10-CM | POA: Diagnosis not present

## 2023-05-17 ENCOUNTER — Ambulatory Visit (HOSPITAL_COMMUNITY)
Admission: RE | Admit: 2023-05-17 | Discharge: 2023-05-17 | Disposition: A | Discharge status: Home / Self Care | Payer: Self-pay | Source: Ambulatory Visit | Attending: Family Medicine | Admitting: Family Medicine

## 2023-05-17 DIAGNOSIS — E785 Hyperlipidemia, unspecified: Secondary | ICD-10-CM | POA: Insufficient documentation

## 2023-06-07 DIAGNOSIS — L309 Dermatitis, unspecified: Secondary | ICD-10-CM | POA: Diagnosis not present

## 2023-06-07 DIAGNOSIS — L308 Other specified dermatitis: Secondary | ICD-10-CM | POA: Diagnosis not present

## 2023-06-15 DIAGNOSIS — L400 Psoriasis vulgaris: Secondary | ICD-10-CM | POA: Diagnosis not present

## 2023-08-04 DIAGNOSIS — Z136 Encounter for screening for cardiovascular disorders: Secondary | ICD-10-CM | POA: Diagnosis not present

## 2023-08-04 DIAGNOSIS — Z125 Encounter for screening for malignant neoplasm of prostate: Secondary | ICD-10-CM | POA: Diagnosis not present

## 2023-08-20 DIAGNOSIS — R5381 Other malaise: Secondary | ICD-10-CM | POA: Diagnosis not present

## 2023-08-20 DIAGNOSIS — R0982 Postnasal drip: Secondary | ICD-10-CM | POA: Diagnosis not present

## 2023-08-20 DIAGNOSIS — J069 Acute upper respiratory infection, unspecified: Secondary | ICD-10-CM | POA: Diagnosis not present

## 2023-08-20 DIAGNOSIS — R0981 Nasal congestion: Secondary | ICD-10-CM | POA: Diagnosis not present

## 2023-08-23 DIAGNOSIS — J988 Other specified respiratory disorders: Secondary | ICD-10-CM | POA: Diagnosis not present

## 2023-09-27 DIAGNOSIS — R3912 Poor urinary stream: Secondary | ICD-10-CM | POA: Diagnosis not present

## 2023-09-27 DIAGNOSIS — R972 Elevated prostate specific antigen [PSA]: Secondary | ICD-10-CM | POA: Diagnosis not present

## 2023-09-27 DIAGNOSIS — N401 Enlarged prostate with lower urinary tract symptoms: Secondary | ICD-10-CM | POA: Diagnosis not present

## 2023-09-27 DIAGNOSIS — N486 Induration penis plastica: Secondary | ICD-10-CM | POA: Diagnosis not present

## 2023-09-27 DIAGNOSIS — R350 Frequency of micturition: Secondary | ICD-10-CM | POA: Diagnosis not present
